# Patient Record
Sex: Female | Born: 1945 | Race: Black or African American | Hispanic: No | Marital: Married | Smoking: Current every day smoker
Health system: Southern US, Community
[De-identification: ages and names within clinical notes are randomized; demographics above are authoritative.]

## PROBLEM LIST (undated history)

## (undated) DIAGNOSIS — I10 Essential (primary) hypertension: Secondary | ICD-10-CM

---

## 2006-07-14 ENCOUNTER — Emergency Department: Payer: Self-pay | Admitting: Emergency Medicine

## 2006-07-14 ENCOUNTER — Other Ambulatory Visit: Payer: Self-pay

## 2006-11-09 ENCOUNTER — Ambulatory Visit: Payer: Self-pay | Admitting: Family Medicine

## 2008-02-23 IMAGING — MG MM CAD SCREENING MAMMO
1 series · 4 of 4 positions shown · non-contrast
Comparison: none

REASON FOR EXAM: Screening mammogram
COMMENTS:

PROCEDURE:     MAM - MAM DGTL SCREENING MAMMO W/CAD  - November 09, 2006  [DATE]
RESULT:     The patient reports last mammogram at [HOSPITAL]. However, there is no
existing prior exam here.

[Series 5807: R CC · right · 4 of 4 slices shown]
[im 1/4]
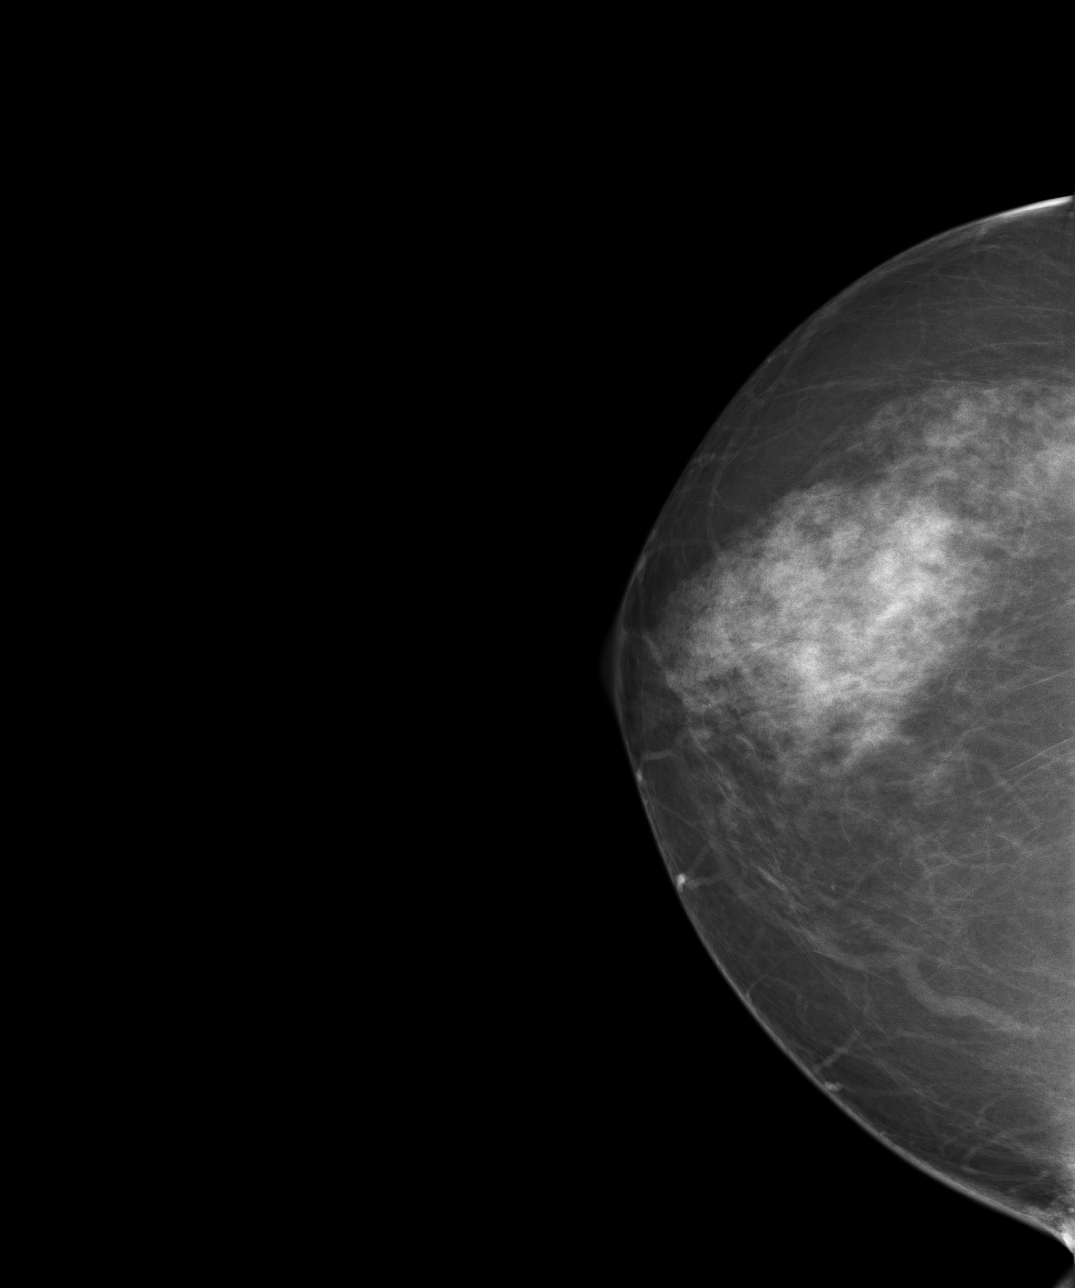
[im 2/4]
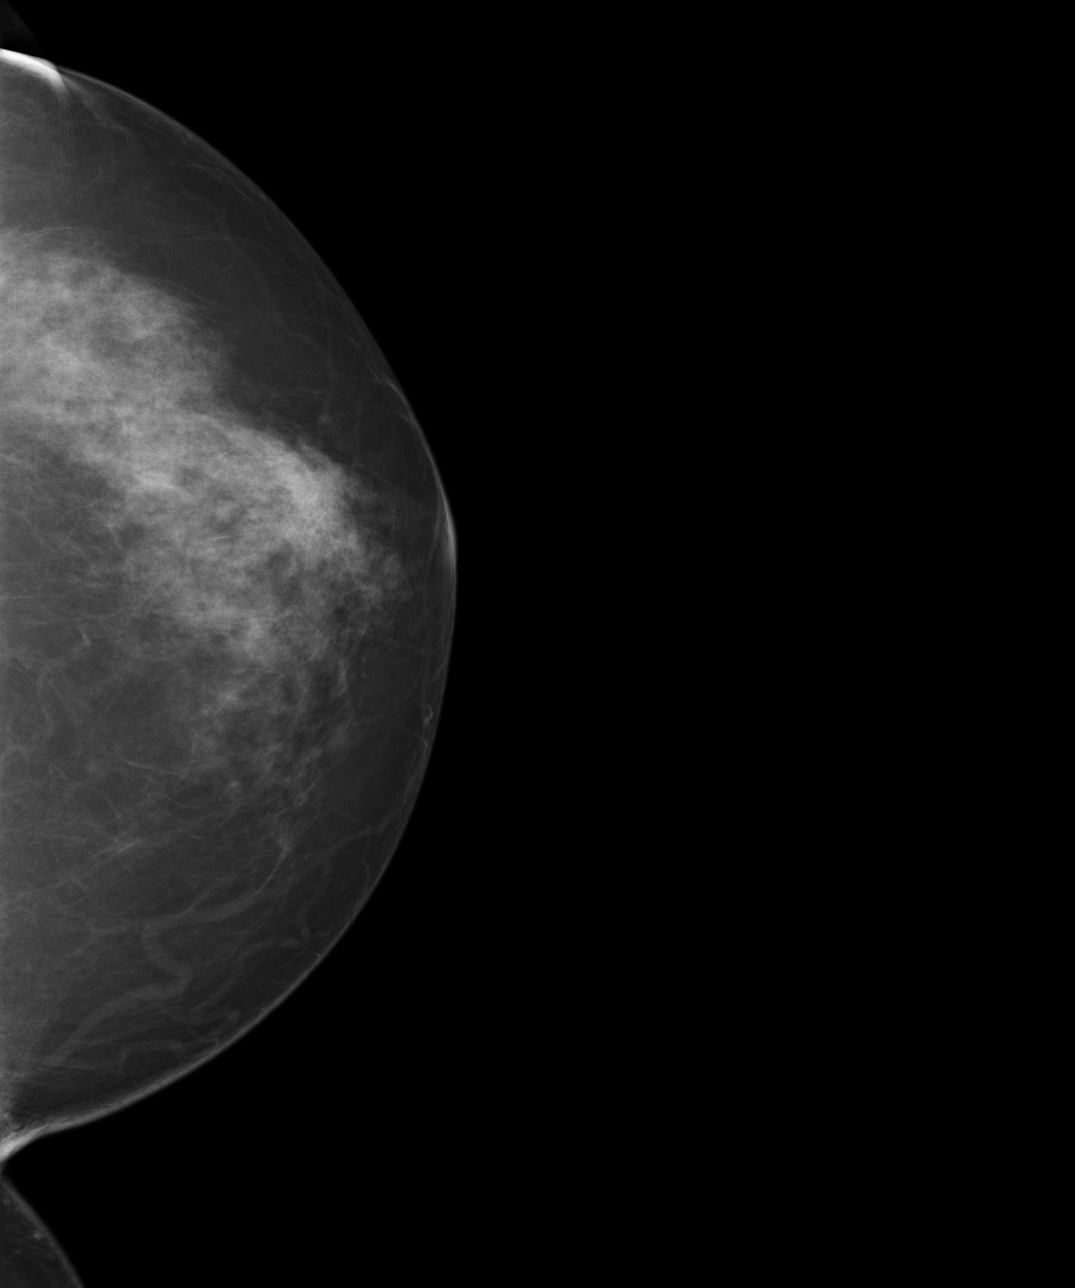
[im 3/4]
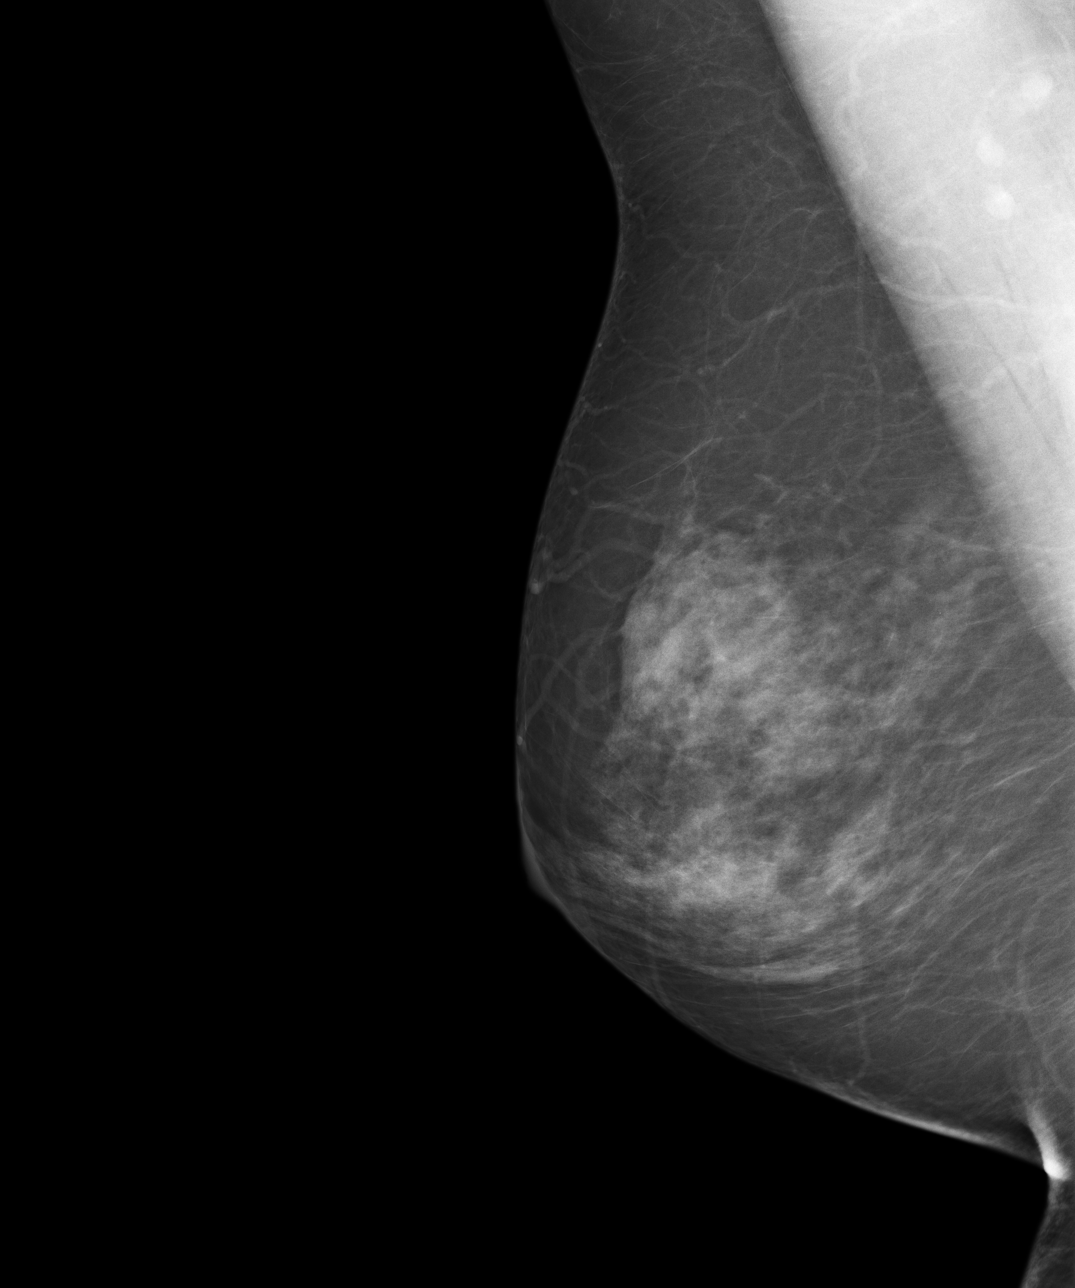
[im 4/4]
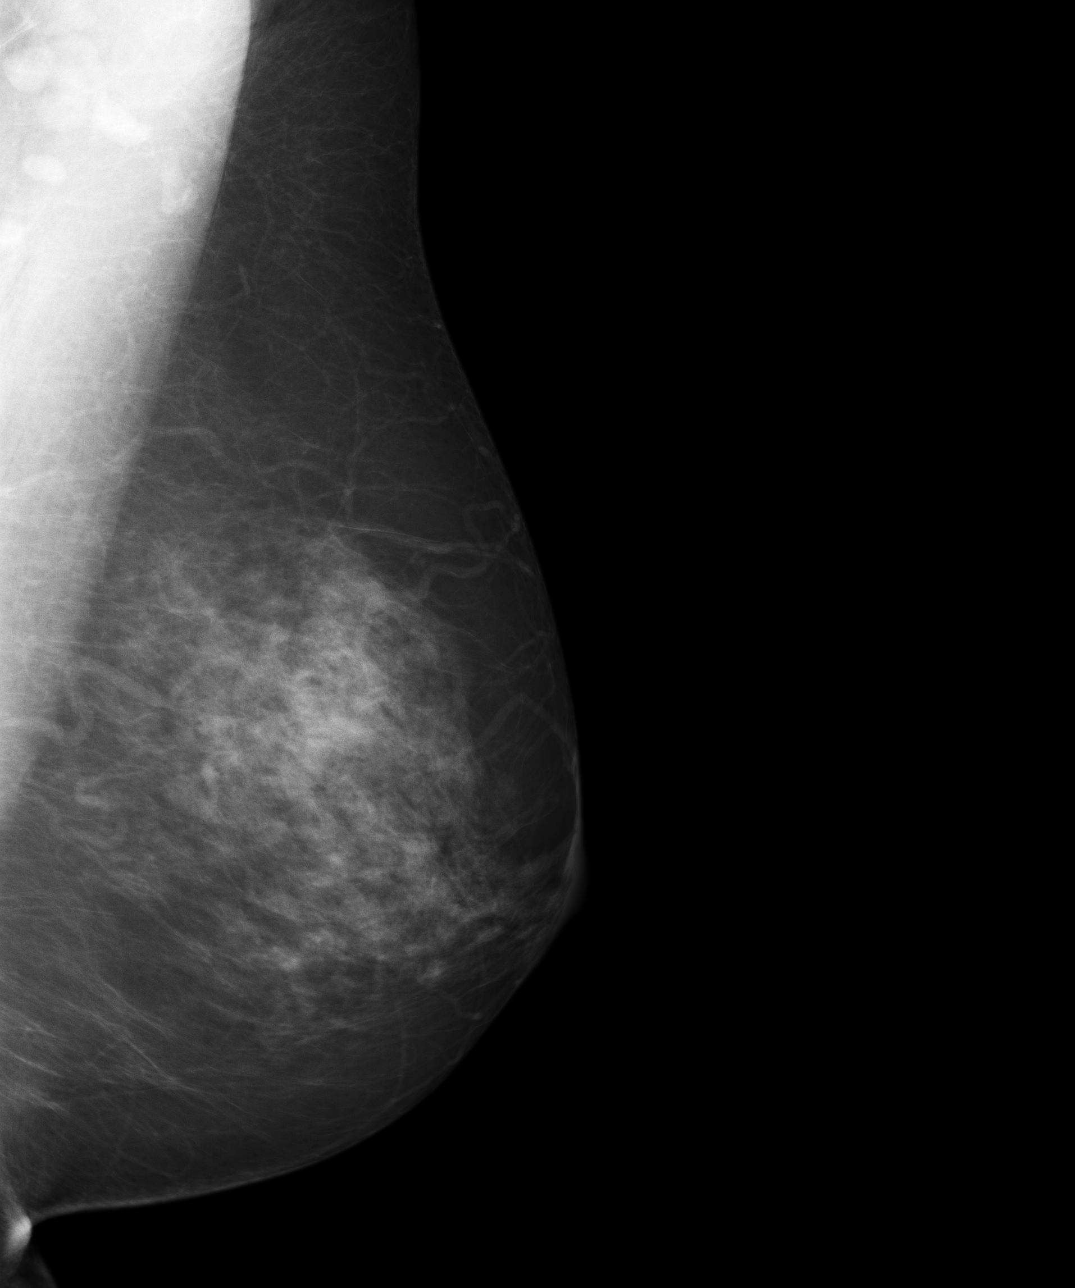

[4 of 4 positions shown; findings below may reference images not displayed]

FINDINGS: Heterogeneously dense bilateral breast parenchyma lowers
sensitivity for mammography.

No suspicious mass or calcification is noted.
IMPRESSION: 1.     Negative bilateral mammogram.
2.     Recommend routine follow-up mammogram in one year.
3.     BI-RADS: Category 1 - Negative

Thank you for this opportunity to contribute to the care of your patient.

A NEGATIVE MAMMOGRAM REPORT DOES NOT PRECLUDE BIOPSY OR OTHER EVALUATION OF
A CLINICALLY PALPABLE OR OTHERWISE SUSPICIOUS MASS OR LESION. BREAST CANCER
MAY NOT BE DETECTED BY MAMMOGRAPHY IN UP TO 10% OF CASES.

## 2009-07-10 ENCOUNTER — Emergency Department: Payer: Self-pay | Admitting: Emergency Medicine

## 2011-07-06 ENCOUNTER — Ambulatory Visit: Payer: Self-pay

## 2012-10-19 IMAGING — MG MM CAD SCREENING MAMMO
1 series · 4 of 4 positions shown · non-contrast
Comparison: none

REASON FOR EXAM: SCR MAMMO NO ORDER DR REI RAUDA [id number]
COMMENTS:

[R CC · right · 4 of 4 slices shown]
[im 1/4]
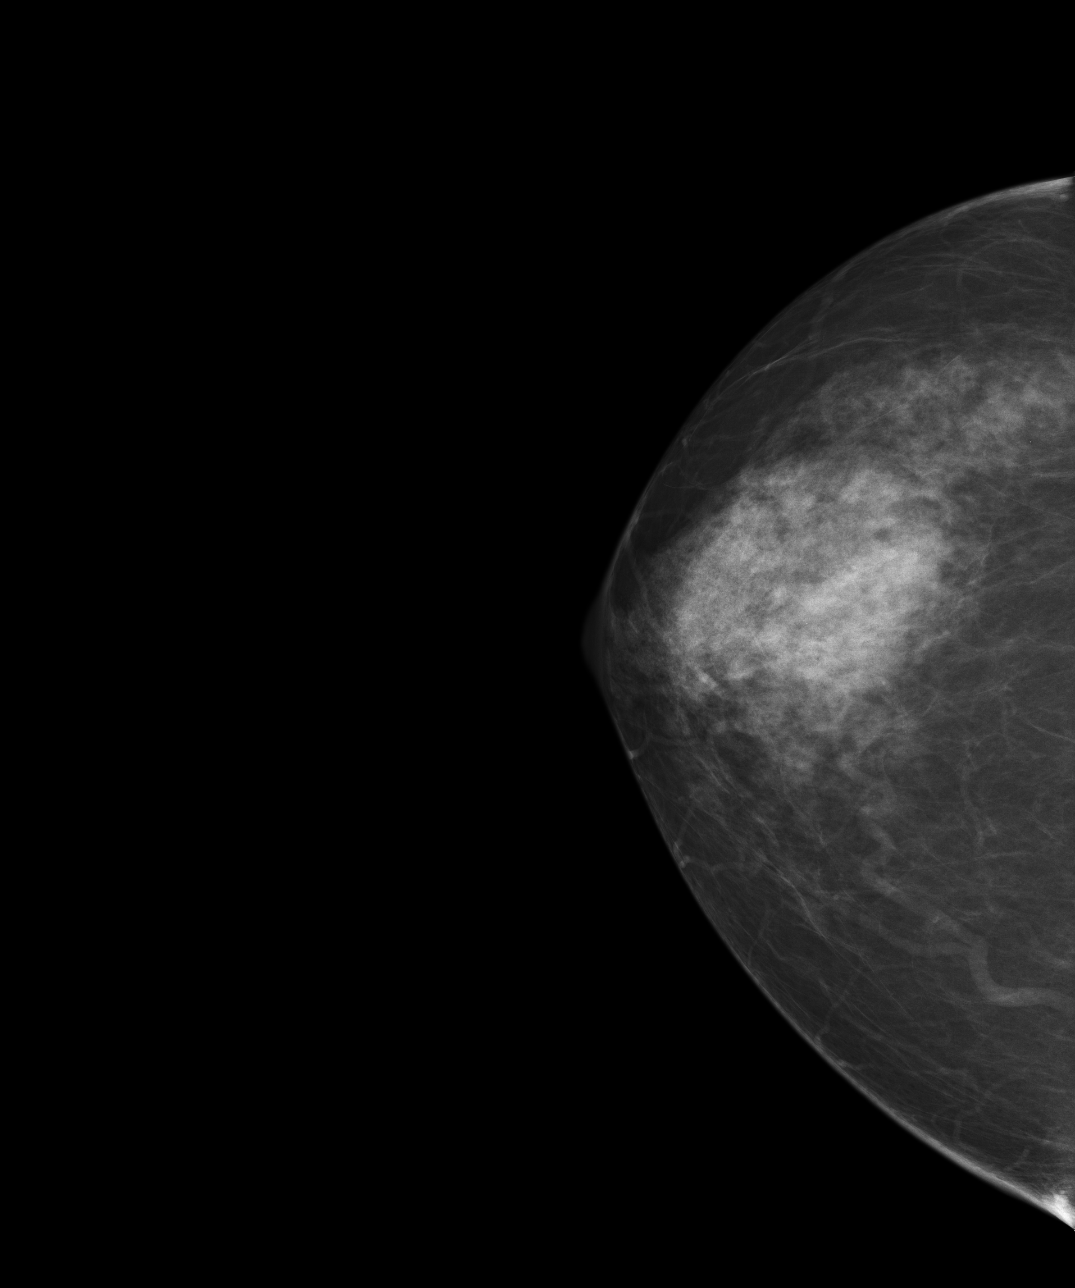
[im 2/4]
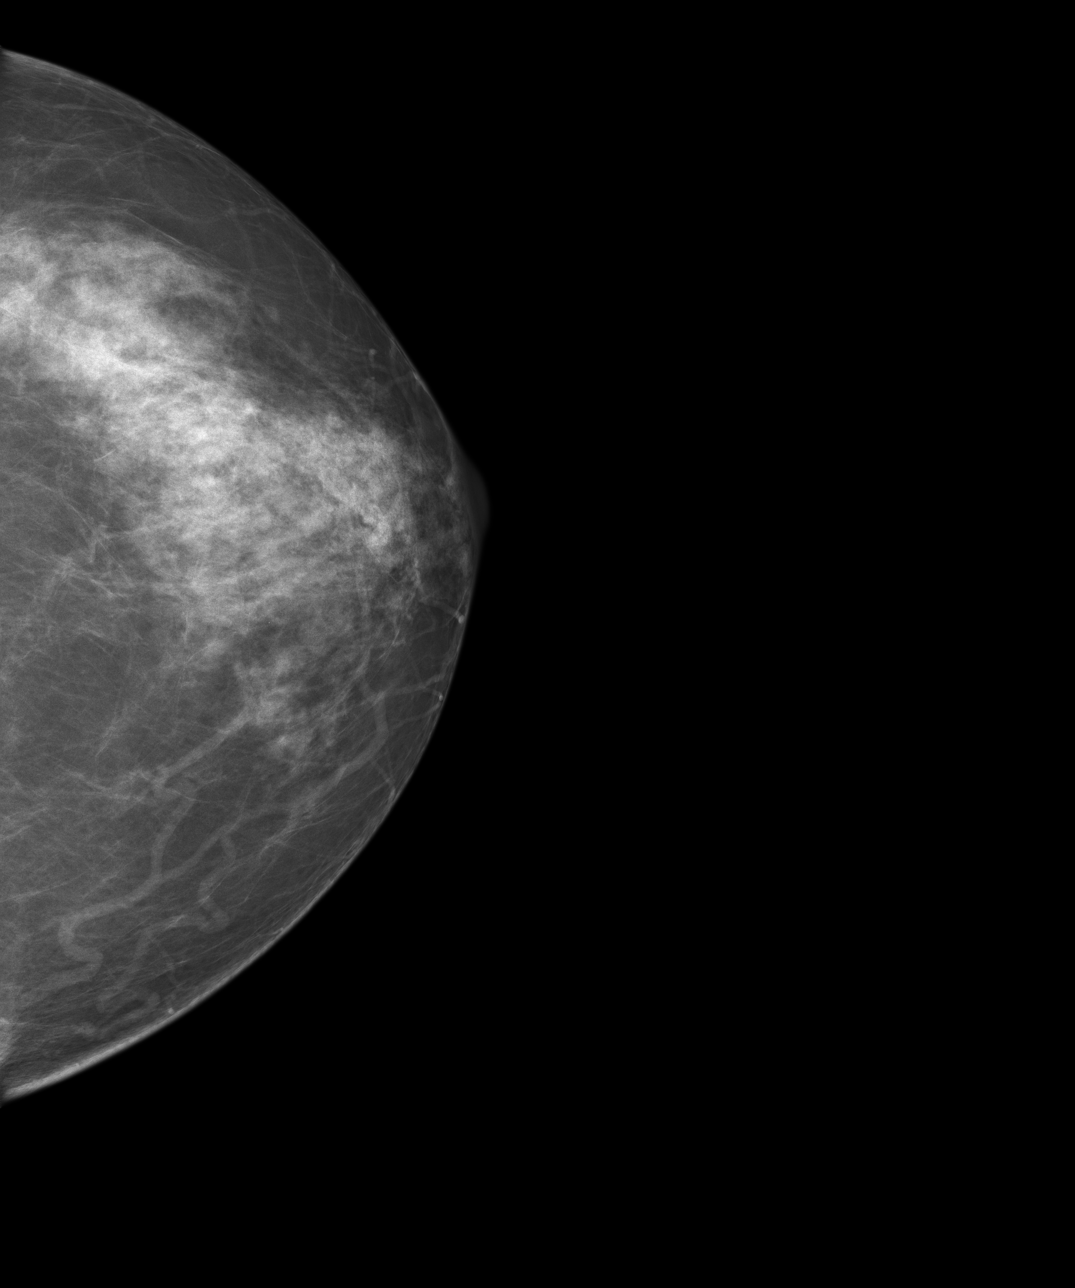
[im 3/4]
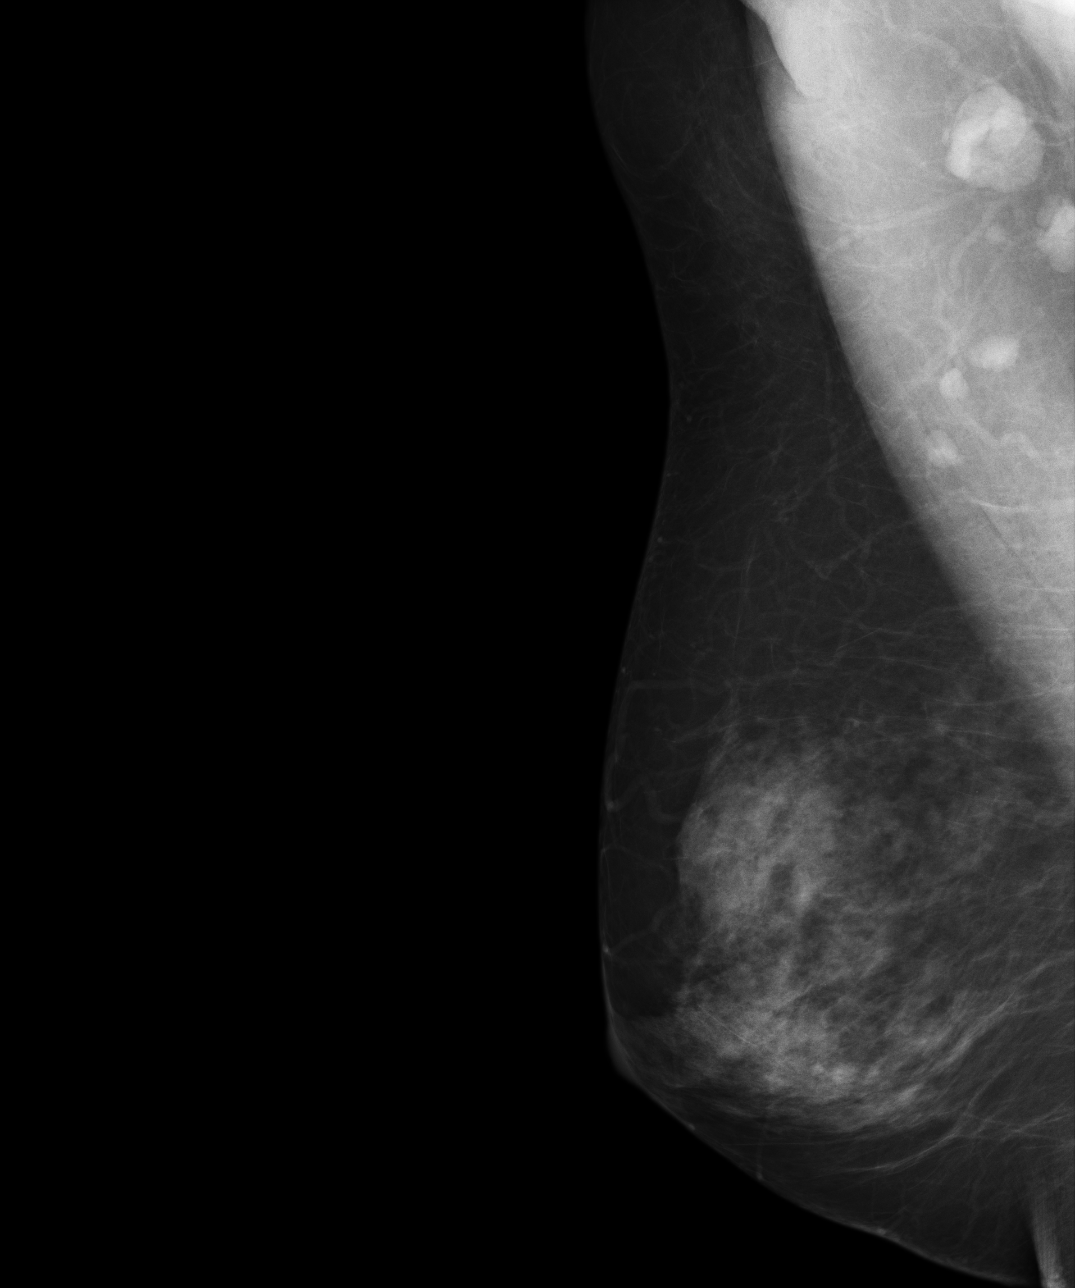
[im 4/4]
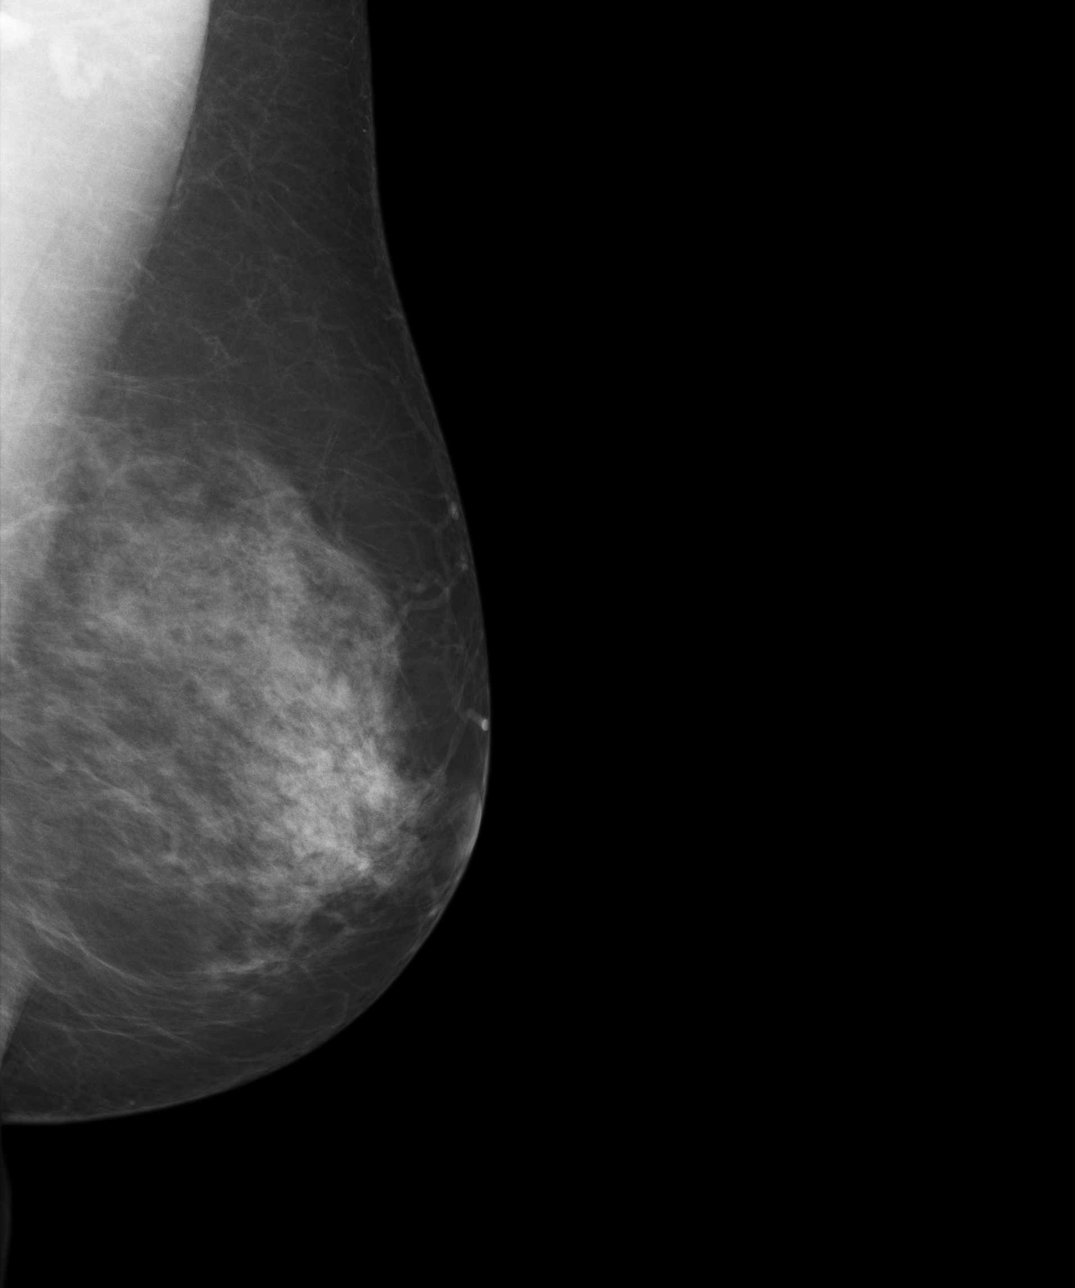

[4 of 4 positions shown; findings below may reference images not displayed]

PROCEDURE:     MAM - MAM DGTL SCRN MAM NO ORDER W/CAD  - July 06, 2011  [DATE]

RESULT:     Comparison is made to the prior exam of October 10, 2006. The
breast parenchyma bilaterally is heterogeneously dense. No dominant mass or
malignant-appearing microcalcifications are seen. Bilateral axillary lymph
nodes are seen. There are a few mildly prominent lymph nodes at the right
axilla. This region was not included on the prior exam of 5999. Nonetheless,
the lymph nodes have a benign appearance with fat observed centrally in the
larger that measures approximately 16 mm.
IMPRESSION: 1. Bilaterally benign-appearing screening mammography.
2. Annual screening mammography is recommended. In particular, repeat
screening mammography in one year is suggested to document stability of the
right axillary lymph nodes.
3. BI-RADS:  Category 2- Benign Finding.

A negative mammogram report does not preclude biopsy or other evaluation of
a clinically palpable or otherwise suspicious mass or lesion.  Breast cancer
may not be detected by mammography in up to 10% of cases.

## 2013-03-06 ENCOUNTER — Ambulatory Visit: Payer: Self-pay | Admitting: Family Medicine

## 2013-04-21 ENCOUNTER — Emergency Department: Payer: Self-pay | Admitting: Emergency Medicine

## 2014-08-05 IMAGING — CR RIGHT FOOT COMPLETE - 3+ VIEW
1 series · 3 of 3 positions shown · non-contrast
Comparison: None

CLINICAL DATA: Twist injury on ladder 5 days ago, pain

EXAM:
RIGHT FOOT COMPLETE - 3+ VIEW

[Series 1: x foot ap right · 0.14mm/px · 3 of 3 slices shown]
[im 1/3]
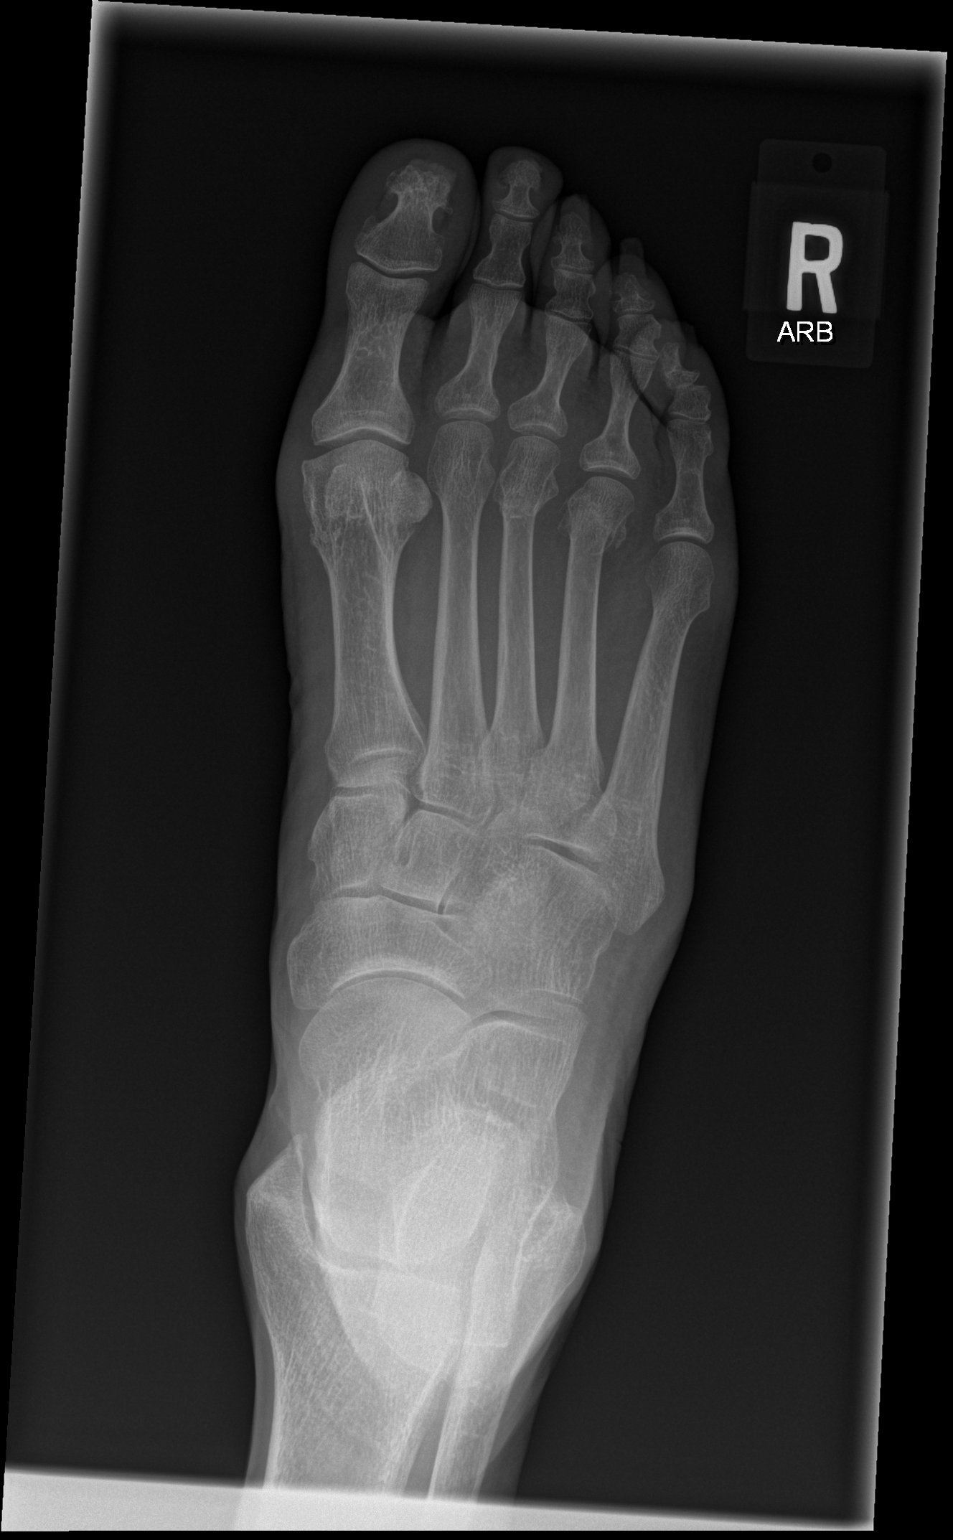
[im 2/3]
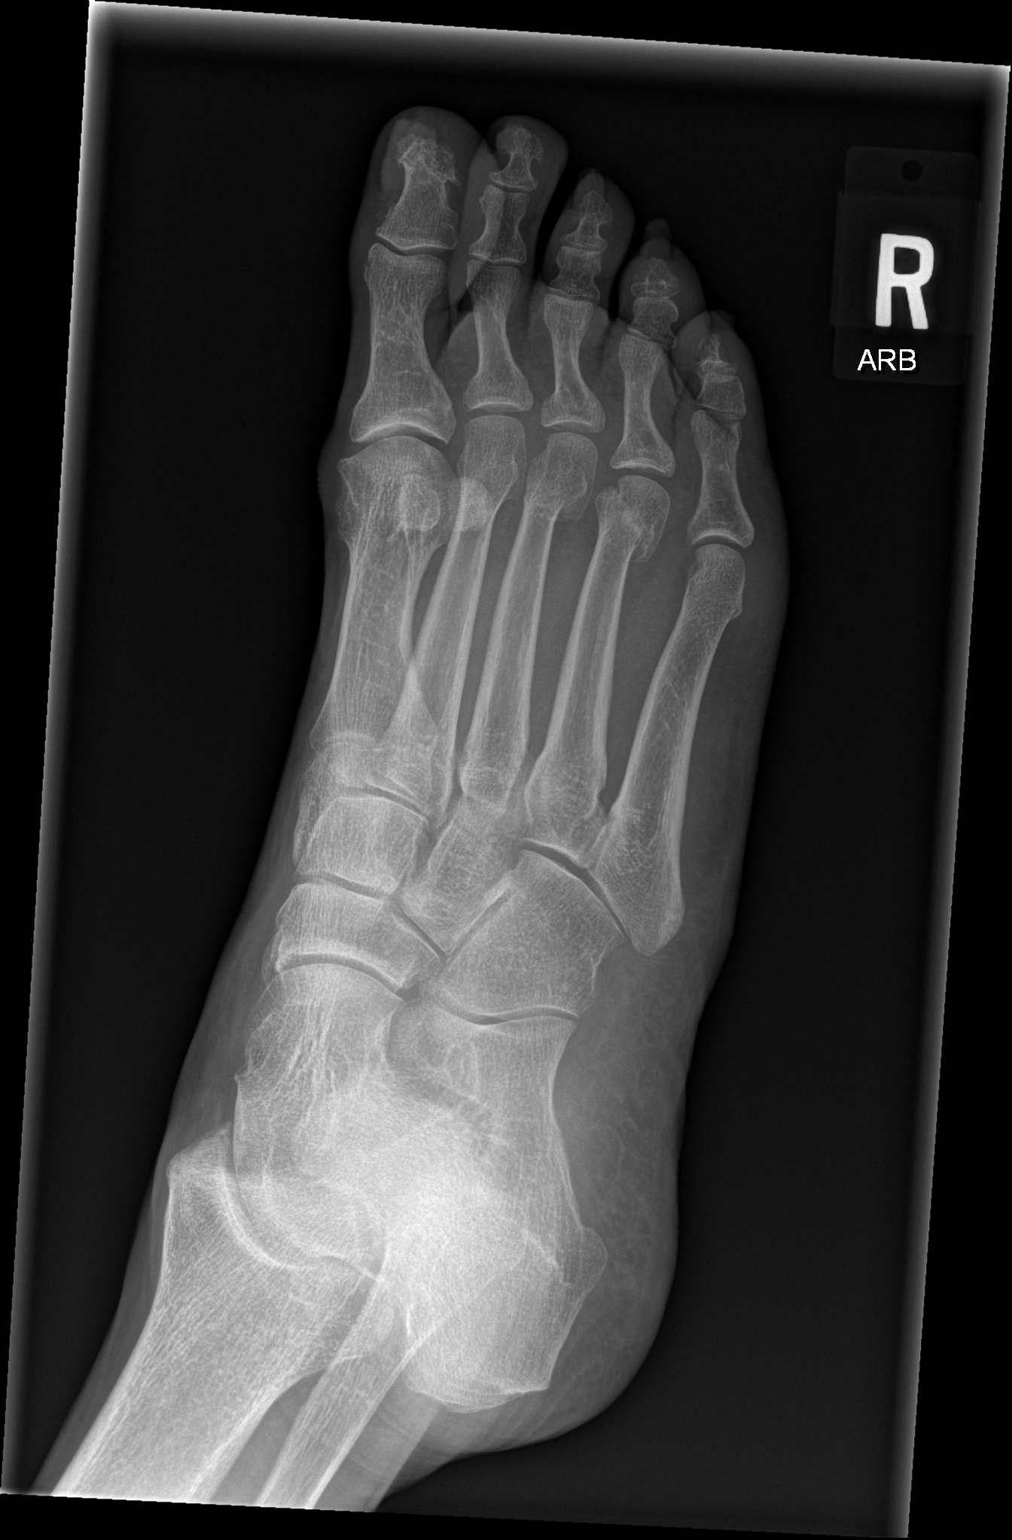
[im 3/3]
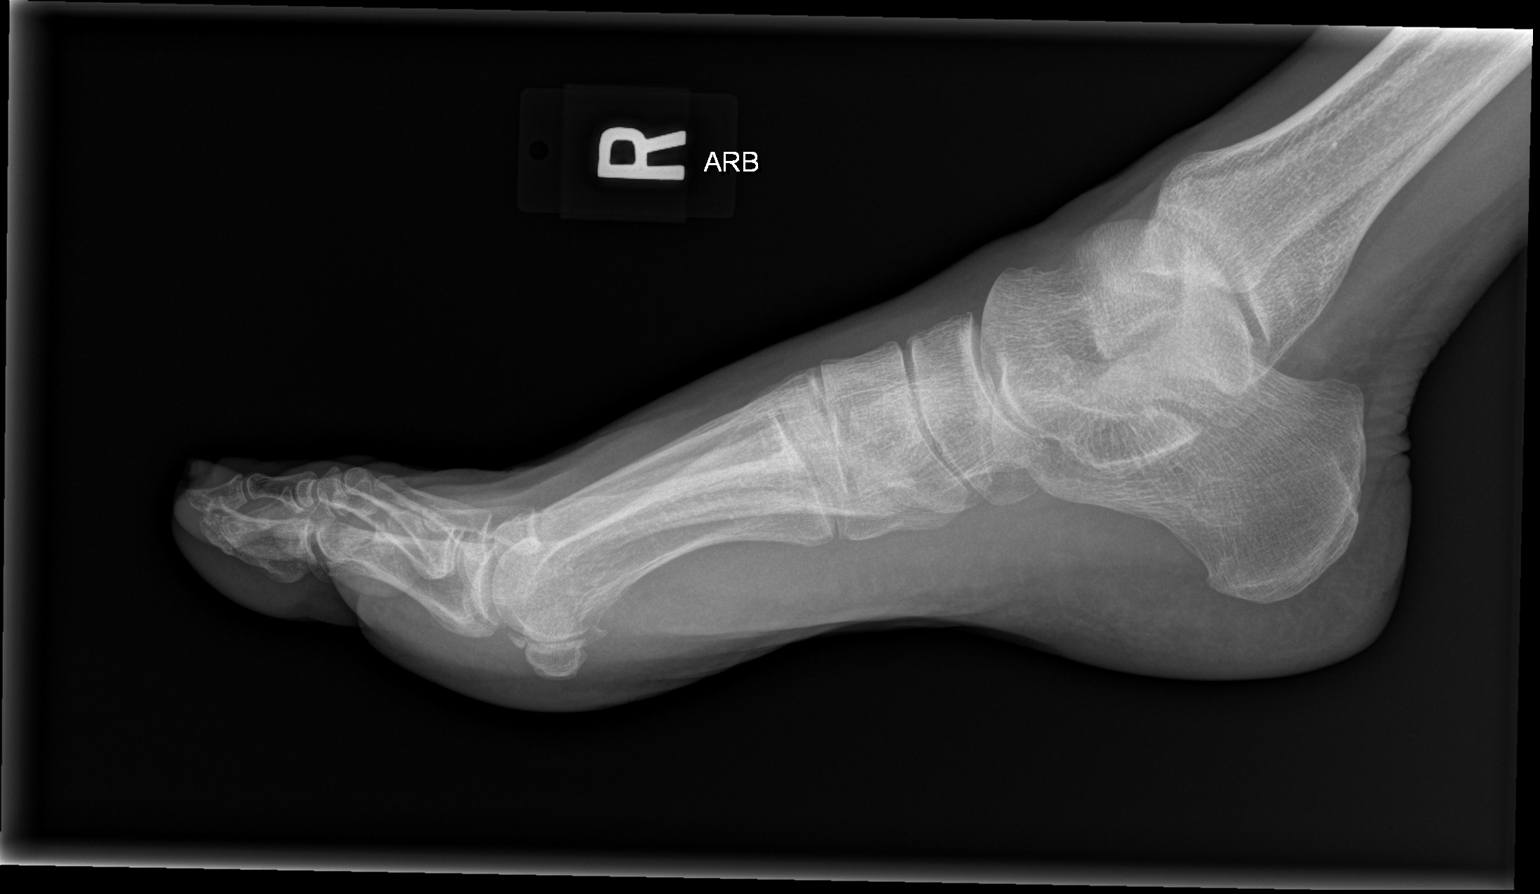

[3 of 3 positions shown; findings below may reference images not displayed]

FINDINGS: Bones appear demineralized.

Minimally displaced fractures of the distal 3rd and 4th metatarsals.

Joint spaces preserved.

No additional fracture dislocation identified.
IMPRESSION: Osseous demineralization with mildly displaced fractures of the
distal right 3rd and 4th metatarsals.

## 2015-06-26 ENCOUNTER — Other Ambulatory Visit: Payer: Self-pay | Admitting: Family Medicine

## 2015-06-26 DIAGNOSIS — Z1231 Encounter for screening mammogram for malignant neoplasm of breast: Secondary | ICD-10-CM

## 2015-07-21 ENCOUNTER — Ambulatory Visit: Payer: Self-pay | Attending: Family Medicine

## 2018-02-22 ENCOUNTER — Other Ambulatory Visit: Payer: Self-pay | Admitting: Family Medicine

## 2018-02-22 DIAGNOSIS — Z1231 Encounter for screening mammogram for malignant neoplasm of breast: Secondary | ICD-10-CM

## 2018-02-22 DIAGNOSIS — E2839 Other primary ovarian failure: Secondary | ICD-10-CM

## 2019-05-29 ENCOUNTER — Other Ambulatory Visit: Payer: Self-pay | Admitting: Family Medicine

## 2019-05-29 DIAGNOSIS — Z1231 Encounter for screening mammogram for malignant neoplasm of breast: Secondary | ICD-10-CM

## 2019-05-29 DIAGNOSIS — E2839 Other primary ovarian failure: Secondary | ICD-10-CM

## 2022-06-04 ENCOUNTER — Inpatient Hospital Stay
Admission: EM | Admit: 2022-06-04 | Discharge: 2022-06-06 | DRG: 522 | Disposition: A | Discharge status: Home Health | Payer: Medicare Other | Attending: Osteopathic Medicine | Admitting: Osteopathic Medicine

## 2022-06-04 ENCOUNTER — Encounter: Payer: Self-pay | Admitting: Emergency Medicine

## 2022-06-04 ENCOUNTER — Other Ambulatory Visit: Payer: Self-pay

## 2022-06-04 ENCOUNTER — Emergency Department: Payer: Medicare Other

## 2022-06-04 DIAGNOSIS — S72091A Other fracture of head and neck of right femur, initial encounter for closed fracture: Secondary | ICD-10-CM | POA: Diagnosis present

## 2022-06-04 DIAGNOSIS — D72829 Elevated white blood cell count, unspecified: Secondary | ICD-10-CM | POA: Insufficient documentation

## 2022-06-04 DIAGNOSIS — Y92008 Other place in unspecified non-institutional (private) residence as the place of occurrence of the external cause: Secondary | ICD-10-CM

## 2022-06-04 DIAGNOSIS — S72001A Fracture of unspecified part of neck of right femur, initial encounter for closed fracture: Principal | ICD-10-CM

## 2022-06-04 DIAGNOSIS — R54 Age-related physical debility: Secondary | ICD-10-CM | POA: Diagnosis present

## 2022-06-04 DIAGNOSIS — I10 Essential (primary) hypertension: Secondary | ICD-10-CM | POA: Diagnosis present

## 2022-06-04 DIAGNOSIS — Z809 Family history of malignant neoplasm, unspecified: Secondary | ICD-10-CM

## 2022-06-04 DIAGNOSIS — Z79899 Other long term (current) drug therapy: Secondary | ICD-10-CM | POA: Diagnosis not present

## 2022-06-04 DIAGNOSIS — E440 Moderate protein-calorie malnutrition: Secondary | ICD-10-CM | POA: Diagnosis present

## 2022-06-04 DIAGNOSIS — W010XXA Fall on same level from slipping, tripping and stumbling without subsequent striking against object, initial encounter: Secondary | ICD-10-CM | POA: Diagnosis present

## 2022-06-04 DIAGNOSIS — I2510 Atherosclerotic heart disease of native coronary artery without angina pectoris: Secondary | ICD-10-CM | POA: Diagnosis present

## 2022-06-04 DIAGNOSIS — I739 Peripheral vascular disease, unspecified: Secondary | ICD-10-CM | POA: Diagnosis present

## 2022-06-04 DIAGNOSIS — E782 Mixed hyperlipidemia: Secondary | ICD-10-CM | POA: Diagnosis not present

## 2022-06-04 DIAGNOSIS — F17210 Nicotine dependence, cigarettes, uncomplicated: Secondary | ICD-10-CM | POA: Diagnosis present

## 2022-06-04 DIAGNOSIS — Z681 Body mass index (BMI) 19 or less, adult: Secondary | ICD-10-CM | POA: Diagnosis not present

## 2022-06-04 DIAGNOSIS — E785 Hyperlipidemia, unspecified: Secondary | ICD-10-CM | POA: Diagnosis present

## 2022-06-04 DIAGNOSIS — S7291XA Unspecified fracture of right femur, initial encounter for closed fracture: Secondary | ICD-10-CM | POA: Diagnosis present

## 2022-06-04 HISTORY — DX: Essential (primary) hypertension: I10

## 2022-06-04 LAB — BASIC METABOLIC PANEL
Anion gap: 10 (ref 5–15)
BUN: 17 mg/dL (ref 8–23)
CO2: 29 mmol/L (ref 22–32)
Calcium: 8.9 mg/dL (ref 8.9–10.3)
Chloride: 98 mmol/L (ref 98–111)
Creatinine, Ser: 0.86 mg/dL (ref 0.44–1.00)
GFR, Estimated: >60 mL/min (ref >60)
Glucose, Bld: 126 mg/dL — ABNORMAL HIGH (ref 70–99)
Potassium: 3.6 mmol/L (ref 3.5–5.1)
Sodium: 137 mmol/L (ref 135–145)

## 2022-06-04 LAB — CBC
HCT: 37.3 % (ref 36.0–46.0)
Hemoglobin: 12.4 g/dL (ref 12.0–15.0)
MCH: 31.9 pg (ref 26.0–34.0)
MCHC: 33.2 g/dL (ref 30.0–36.0)
MCV: 95.9 fL (ref 80.0–100.0)
Platelets: 313 10*3/uL (ref 150–400)
RBC: 3.89 MIL/uL (ref 3.87–5.11)
RDW: 14 % (ref 11.5–15.5)
WBC: 12.1 10*3/uL — ABNORMAL HIGH (ref 4.0–10.5)
nRBC: 0 % (ref 0.0–0.2)

## 2022-06-04 LAB — PROTIME-INR
INR: 1 (ref 0.8–1.2)
Prothrombin Time: 13.5 seconds (ref 11.4–15.2)

## 2022-06-04 MED ORDER — MORPHINE SULFATE (PF) 2 MG/ML IV SOLN: 1.0000 mg | INTRAVENOUS | Status: DC | PRN

## 2022-06-04 MED ORDER — MORPHINE SULFATE (PF) 2 MG/ML IV SOLN
1.0000 mg | INTRAVENOUS | Status: AC | PRN
  Administered 2022-06-04 – 2022-06-05 (×3): 1 mg via INTRAVENOUS
  Filled 2022-06-04 (×3): qty 1

## 2022-06-04 MED ORDER — HYDROCODONE-ACETAMINOPHEN 5-325 MG PO TABS: 1.0000 | ORAL_TABLET | Freq: Four times a day (QID) | ORAL | Status: DC | PRN

## 2022-06-04 MED ORDER — FENTANYL CITRATE PF 50 MCG/ML IJ SOSY
25.0000 ug | PREFILLED_SYRINGE | Freq: Once | INTRAMUSCULAR | Status: AC
  Administered 2022-06-04: 25 ug via INTRAVENOUS
  Filled 2022-06-04: qty 1

## 2022-06-04 MED ORDER — ROSUVASTATIN CALCIUM 10 MG PO TABS
10.0000 mg | ORAL_TABLET | Freq: Every day | ORAL | Status: DC
  Administered 2022-06-04 – 2022-06-05 (×2): 10 mg via ORAL
  Filled 2022-06-04 (×2): qty 1

## 2022-06-04 MED ORDER — HYDRALAZINE HCL 20 MG/ML IJ SOLN: 5.0000 mg | Freq: Three times a day (TID) | INTRAMUSCULAR | Status: DC | PRN

## 2022-06-04 MED ORDER — MORPHINE SULFATE (PF) 2 MG/ML IV SOLN: 0.5000 mg | INTRAVENOUS | Status: DC | PRN

## 2022-06-04 MED ORDER — HEPARIN SODIUM (PORCINE) 5000 UNIT/ML IJ SOLN: 5000.0000 [IU] | Freq: Three times a day (TID) | INTRAMUSCULAR | Status: DC

## 2022-06-04 MED ORDER — METOPROLOL SUCCINATE ER 50 MG PO TB24
50.0000 mg | ORAL_TABLET | Freq: Every day | ORAL | Status: DC
  Administered 2022-06-06: 50 mg via ORAL
  Filled 2022-06-04: qty 1

## 2022-06-04 MED ORDER — AMLODIPINE BESYLATE 5 MG PO TABS
5.0000 mg | ORAL_TABLET | Freq: Every day | ORAL | Status: DC
  Administered 2022-06-06: 5 mg via ORAL
  Filled 2022-06-04: qty 1

## 2022-06-04 MED ORDER — NICOTINE 21 MG/24HR TD PT24
21.0000 mg | MEDICATED_PATCH | Freq: Every day | TRANSDERMAL | Status: DC | PRN
  Administered 2022-06-04: 21 mg via TRANSDERMAL
  Filled 2022-06-04: qty 1

## 2022-06-04 MED ORDER — LOSARTAN POTASSIUM 50 MG PO TABS
100.0000 mg | ORAL_TABLET | Freq: Every day | ORAL | Status: DC
  Administered 2022-06-06: 100 mg via ORAL
  Filled 2022-06-04: qty 2

## 2022-06-04 MED ORDER — HYDROCODONE-ACETAMINOPHEN 5-325 MG PO TABS
1.0000 | ORAL_TABLET | Freq: Four times a day (QID) | ORAL | Status: DC | PRN
  Administered 2022-06-04 – 2022-06-06 (×2): 1 via ORAL
  Filled 2022-06-04 (×2): qty 1

## 2022-06-04 MED ORDER — SENNOSIDES-DOCUSATE SODIUM 8.6-50 MG PO TABS: 1.0000 | ORAL_TABLET | Freq: Every evening | ORAL | Status: DC | PRN

## 2022-06-04 NOTE — Hospital Course (Signed)
Translator Ms. Veronica Lucas is a 77 year old female with history of hypertension, multiple lung nodules, who presents emergency department for chief concerns of ground level fall.  02/10: VSS/hypertensive, WBC slightly up at 12.1, CXR no concerns, Right hip x-ray was (+)right femoral neck fracture. EDP consulted orthopedic surgery who states that the patient will go for a right hip hemiarthroplasty tomorrow. 02/11: underwent Right Hip Anterior Hip Hemiarthroplasty. PT/OT ordered for tomorrow.   02/12:   Consultants:  Orthopedics   Procedures: 06/05/22 Right Hip Anterior Hip Hemiarthroplasty       ASSESSMENT & PLAN:   Principal Problem:   Right femoral fracture (HCC) Active Problems:   Tobacco smoker, 1 pack of cigarettes or less per day   Essential hypertension   Hyperlipidemia   Leukocytosis   Right femoral fracture (HCC) Orthopedics following POD1 Right Hip Anterior Hip Hemiarthroplasty  Fall precaution Hydrocodone-acetaminophen 5-325 mg p.o. every 6 hours as needed for moderate pain, 3 doses ordered. Morphine 1 mg IV every 2 hours as needed for severe pain   Leukocytosis Suspect reactive secondary to right femoral fracture CBC in a.m.   Hyperlipidemia Rosuvastatin 10 mg nightly resumed   Essential hypertension Resumed home amlodipine 5 mg daily, losartan 100 mg daily, metoprolol succinate 50 mg daily Hydralazine 5 mg IV every 8 hours as needed for SBP greater 175, 4 days ordered   Tobacco smoker, 1 pack of cigarettes or less per day Patient states she is not ready to quit smoking Prn nicotine patch ordered    DVT prophylaxis: TED hose pending surgery  Pertinent IV fluids/nutrition: no continuous IV fluids, NPO pending surgery   Central lines / invasive devices: none  Code Status: FULL CODE   Current Admission Status: inpatient  TOC needs / Dispo plan: likely will need SNF/HH - pt prefers to go back home if possible but she does live by herself so would need  to be relatively ambulatory and have appropriate DME/help available, PT/OT to assess postop Barriers to discharge / significant pending items: surgery yesterday, PT/OT today, expect may need HH/placement.

## 2022-06-04 NOTE — Assessment & Plan Note (Signed)
-   Suspect reactive secondary to right femoral fracture - Leukocytosis patient for infectious etiology due to negative symptoms on review of system - CBC in a.m.

## 2022-06-04 NOTE — Assessment & Plan Note (Addendum)
-   Fall precaution - Hydrocodone-acetaminophen 5-325 mg p.o. every 6 hours as needed for moderate pain, 3 doses ordered - Morphine 1 mg IV every 2 hours as needed for severe pain - Orthopedic service has been consulted - N.p.o. after midnight - Admit to [facility], inpatient

## 2022-06-04 NOTE — Assessment & Plan Note (Signed)
-   Rosuvastatin 10 mg nightly resumed

## 2022-06-04 NOTE — ED Provider Notes (Signed)
[facility] Provider Note    Event Date/Time   First MD Initiated Contact with Patient 06/04/22 1636     (approximate)   History   No chief complaint on file.   HPI  Veronica Lucas is a 77 y.o. female history of hypertension  She was walking today had her purse in her hand but the strap from that was hanging down.  She believes she stumbled or tripped over the strap.  Caused her to fall directly on her right hip and she has severe pain in the hip only.  No other injury did not strike her head no neck pain no injury to the left leg feet ankles, abdomen back or other areas.  Currently pain is very minimal if any but does hurt if she attempts to move the right hip.   Review of [facility] medical record from October indicates history of lung nodules, hypertension  Patient's daughter also at bedside  Physical Exam   Triage Vital Signs: ED Triage Vitals  Enc Vitals Group     BP 06/04/22 1629 (!) 184/24     Pulse Rate 06/04/22 1629 69     Resp 06/04/22 1629 20     Temp 06/04/22 1629 (!) 97.5 F (36.4 C)     Temp Source 06/04/22 1629 Oral     SpO2 06/04/22 1629 99 %     Weight 06/04/22 1626 96 lb (43.5 kg)     Height 06/04/22 1626 5' 6"$  (1.676 m)     Head Circumference --      Peak Flow --      Pain Score 06/04/22 1626 0     Pain Loc --      Pain Edu? --      Excl. in [place]? --     Most recent vital signs: Vitals:   06/04/22 1629 06/04/22 1630  BP: (!) 184/24 (!) 174/77  Pulse: 69 72  Resp: 20 10  Temp: (!) 97.5 F (36.4 C)   SpO2: 99% 99%     General: Awake, no distress.  Ports her pain is well-controlled at this time she is resting comfortably.  Right leg held in slight external rotation CV:  Good peripheral perfusion.  Strong palpable dorsalis pedis and posterior tibial pulses in the right lower extremity.  Normal heart tones and rate Resp:  Normal effort.  Clear bilateral Abd:  No distention.  Soft nontender.  No tenderness noted over the bony  pelvis. Other:  Warm well-perfused lower extremities.  Moves upper extremities without injury or pain.  Right lower extremity slightly externally rotated, able to wiggle her toes.  No tenderness to palpation along the foot and ankle tib-fib knee but has tenderness over the right upper leg and hip joint.  No open wounds abrasions or contusion noted over the right hip joint.   ED Results / Procedures / Treatments   Labs (all labs ordered are listed, but only abnormal results are displayed) Labs Reviewed  CBC - Abnormal; Notable for the following components:      Result Value   WBC 12.1 (*)    All other components within normal limits  BASIC METABOLIC PANEL - Abnormal; Notable for the following components:   Glucose, Bld 126 (*)    All other components within normal limits  PROTIME-INR  CBC  BASIC METABOLIC PANEL     EKG     RADIOLOGY  Right hip x-ray interpreted by me as right femoral neck fracture  DG Chest [facility]  1 View  Result Date: 06/04/2022 CLINICAL DATA:  Ground level fall.  Right hip fracture. EXAM: PORTABLE CHEST 1 VIEW COMPARISON:  None Available. FINDINGS: Cardiac silhouette normal in size.  No mediastinal or hilar masses. Clear lungs. No gross pleural effusion or pneumothorax on this supine study. Skeletal structures are grossly intact. IMPRESSION: No acute cardiopulmonary disease. Electronically Signed   By: Lajean Manes M.D.   On: 06/04/2022 17:22   DG Hip Unilat W or Wo Pelvis 2-3 Views Right  Result Date: 06/04/2022 CLINICAL DATA:  Ground level fall.  Right hip pain. EXAM: DG HIP (WITH OR WITHOUT PELVIS) 2-3V RIGHT COMPARISON:  None Available. FINDINGS: Right femoral neck fracture, mid cervical, non comminuted, mildly displaced with varus angulation. No other fractures.  No bone lesions. Hip joints, SI joints and pubic symphysis are normally spaced and aligned. Scattered iliofemoral arterial vascular calcifications. IMPRESSION: 1. Mildly displaced, varus angulated  right femoral neck fracture. No dislocation. Electronically Signed   By: Lajean Manes M.D.   On: 06/04/2022 17:21      PROCEDURES:  Critical Care performed: No  Procedures   MEDICATIONS ORDERED IN ED: Medications  morphine (PF) 2 MG/ML injection 0.5 mg (has no administration in time range)  heparin injection 5,000 Units (has no administration in time range)  senna-docusate (Senokot-S) tablet 1 tablet (has no administration in time range)  HYDROcodone-acetaminophen (NORCO/VICODIN) 5-325 MG per tablet 1 tablet (has no administration in time range)  fentaNYL (SUBLIMAZE) injection 25 mcg (25 mcg Intravenous Given 06/04/22 1851)     IMPRESSION / MDM / ASSESSMENT AND PLAN / ED COURSE  I reviewed the triage vital signs and the nursing notes.                              Differential diagnosis includes, but is not limited to, contusion, fall, pelvic fracture, right hip fracture etc.  Fall appears to be mechanical in nature and accidental she landed directly on her right hip did not strike her head no other injuries no neck pain.  She is alert well-oriented not in any anticoagulants.  Pain is currently well-controlled after EMS intervention, she is neurovascular intact in right lower extremity without evidence of compromise.  X-ray demonstrates right hip fracture.  I discussed her case with Dr. Harlow Mares of orthopedic surgery who advises current plan to make n.p.o. at midnight and admit to hospitalist  Patient's presentation is most consistent with acute presentation with potential threat to life or bodily function. (Loss of R hip mobility)   The patient is on the cardiac monitor to evaluate for evidence of arrhythmia and/or significant heart rate changes.   ----------------------------------------- 7:04 PM on 06/04/2022 ----------------------------------------- Consulted with the hospitalist service, patient be admitted to the service of Dr. Tobie Poet.  Labs reviewed mild leukocytosis.  Metabolic  panel normal.  Patient resting comfortably, additional fentanyl given for some breakthrough pain.  Patient and her daughter both aware understanding of plan for admission to the hospital and diagnosis of hip fracture.  Anticipate Dr. Harlow Mares will see and evaluate the patient     FINAL CLINICAL IMPRESSION(S) / ED DIAGNOSES   Final diagnoses:  Closed right hip fracture, initial encounter [facility])     Rx / DC Orders   ED Discharge Orders     None        Note:  This document was prepared using Dragon voice recognition software and may include unintentional dictation errors.   Ninoska Goswick,  Elta Guadeloupe, MD 06/04/22 Drema Halon

## 2022-06-04 NOTE — ED Notes (Signed)
Pt placed on purewick 

## 2022-06-04 NOTE — H&P (Addendum)
History and Physical   Veronica Lucas [id number] DOB: 1945/07/03 DOA: 06/04/2022  PCP: Wardell Honour, MD  Patient coming from: home via EMS  I have personally briefly reviewed patient's old medical records in [place].  Chief Concern: fall  HPI: Translator Ms. Veronica Lucas is a 77 year old female with history of hypertension, multiple lung nodules, who presents emergency department for chief concerns of ground level fall.   Initial vitals in the ED showed temperature 97.5, respiration rate of 10, heart rate 72, blood pressure 174 over 77, SpO2 of 99 on RA.   Serum sodium is 137, k 3.6, chloride 98, bicarb 29, BUN of 17, serum creatinine 0.86, nonfasting blood glucose 126, EGFR greater than 60, WBC 12.1.  Hemoglobin 12.4, platelets of 313.  Right hip x-ray was read as right femoral neck fracture.  EDP consulted orthopedic surgery who states that the patient will go for a right hip hemiarthroplasty tomorrow.  ED treatment: Fentanyl 25 mcg IV ------------------------ At bedside, she is awake and alert to name, age, location, current year.  She does not appear to be in acute distress. She was walking with her handbag that had a long shoulder strap and she stripped over the handbag strap. She denies head trauma, lost of consciousness.   She denies chest pain, shortness of breath, abdominal pain, dysuria, nausea, vomiting, hematuria, diarrhea.   Social history: She lives alone. She smokes 1 ppd. She denies etoh, recreational drug use. She is retired, and formerly worked as Pension scheme manager.   ROS: Constitutional: no weight change, no fever ENT/Mouth: no sore throat, no rhinorrhea Eyes: no eye pain, no vision changes Cardiovascular: no chest pain, no dyspnea,  no edema, no palpitations Respiratory: no cough, no sputum, no wheezing Gastrointestinal: no nausea, no vomiting, no diarrhea, no constipation Genitourinary: no urinary incontinence, no dysuria, no  hematuria Musculoskeletal: no arthralgias, no myalgias Skin: no skin lesions, no pruritus, Neuro: + weakness, no loss of consciousness, no syncope Psych: no anxiety, no depression, + decrease appetite Heme/Lymph: no bruising, no bleeding  ED Course: Discussed with emergency medicine provider, patient requiring hospitalization for chief concerns of right femoral neck fracture.   Assessment/Plan  Principal Problem:   Right femoral fracture (HCC) Active Problems:   Tobacco smoker, 1 pack of cigarettes or less per day   Essential hypertension   Hyperlipidemia   Leukocytosis   Assessment and Plan:  * Right femoral fracture (HCC) - Fall precaution - Hydrocodone-acetaminophen 5-325 mg p.o. every 6 hours as needed for moderate pain, 3 doses ordered - Morphine 1 mg IV every 2 hours as needed for severe pain - Orthopedic service has been consulted - N.p.o. after midnight - Admit to MedSurg, inpatient  Leukocytosis - Suspect reactive secondary to right femoral fracture - Leukocytosis patient for infectious etiology due to negative symptoms on review of system - CBC in a.m.  Hyperlipidemia - Rosuvastatin 10 mg nightly resumed  Essential hypertension - Resumed home amlodipine 5 mg daily, losartan 100 mg daily, metoprolol succinate 50 mg daily - Hydralazine 5 mg IV every 8 hours as needed for SBP greater 175, 4 days ordered  Tobacco smoker, 1 pack of cigarettes or less per day - Patient states she is not ready to quit smoking - Prn nicotine patch ordered  Chart reviewed.   DVT prophylaxis: TED hose, a.m. team to initiate pharmacologic DVT prophylaxis when the benefits outweigh the risk Code Status: Full code Diet: Regular diet; n.p.o. at midnight Family Communication: A phone call was  offered, patient declined stating that her daughter he knows she is in the hospital Disposition Plan: Pending clinical course Consults called: Orthopedic service Admission status: MedSurg,  inpatient  Past Medical History:  Diagnosis Date   Hypertension    Past Surgical History:  Procedure Laterality Date   CESAREAN SECTION     Social History:  reports that she has been smoking cigarettes. She has been smoking an average of 1 pack per day. She has never used smokeless tobacco. She reports that she does not drink alcohol and does not use drugs.  No Known Allergies Family History  Problem Relation Age of Onset   Cancer Mother    Family history: Family history reviewed and not pertinent  Prior to Admission medications   Amlodipine 5 mg daily   Physical Exam: Vitals:   06/04/22 1626 06/04/22 1629 06/04/22 1630  BP:  (!) 184/24 (!) 174/77  Pulse:  69 72  Resp:  20 10  Temp:  (!) 97.5 F (36.4 C)   TempSrc:  Oral   SpO2:  99% 99%  Weight: 43.5 kg    Height: 5' 6"$  (1.676 m)     Constitutional: appears age-appropriate, frail, NAD, calm, comfortable Eyes: PERRL, lids and conjunctivae normal ENMT: Mucous membranes are moist. Posterior pharynx clear of any exudate or lesions. Age-appropriate dentition. Hearing appropriate Neck: normal, supple, no masses, no thyromegaly Respiratory: clear to auscultation bilaterally, no wheezing, no crackles. Normal respiratory effort. No accessory muscle use.  Cardiovascular: Regular rate and rhythm, no murmurs / rubs / gallops. No extremity edema. 2+ pedal pulses. No carotid bruits.  Abdomen: no tenderness, no masses palpated, no hepatosplenomegaly. Bowel sounds positive.  Musculoskeletal: no clubbing / cyanosis. No joint deformity upper and lower extremities.  Decreased range of motion of the right lower extremity. no contractures, no atrophy. Normal muscle tone. Pain at the right hip Skin: no rashes, lesions, ulcers. No induration Neurologic: Sensation intact. Strength 5/5 in all 4.  Psychiatric: Normal judgment and insight. Alert and oriented x 3. Normal mood.   EKG: Not indicated at this time  Chest x-ray on Admission: I  personally reviewed and I agree with radiologist reading as below.  DG Chest Port 1 View  Result Date: 06/04/2022 CLINICAL DATA:  Ground level fall.  Right hip fracture. EXAM: PORTABLE CHEST 1 VIEW COMPARISON:  None Available. FINDINGS: Cardiac silhouette normal in size.  No mediastinal or hilar masses. Clear lungs. No gross pleural effusion or pneumothorax on this supine study. Skeletal structures are grossly intact. IMPRESSION: No acute cardiopulmonary disease. Electronically Signed   By: Lajean Manes M.D.   On: 06/04/2022 17:22   DG Hip Unilat W or Wo Pelvis 2-3 Views Right  Result Date: 06/04/2022 CLINICAL DATA:  Ground level fall.  Right hip pain. EXAM: DG HIP (WITH OR WITHOUT PELVIS) 2-3V RIGHT COMPARISON:  None Available. FINDINGS: Right femoral neck fracture, mid cervical, non comminuted, mildly displaced with varus angulation. No other fractures.  No bone lesions. Hip joints, SI joints and pubic symphysis are normally spaced and aligned. Scattered iliofemoral arterial vascular calcifications. IMPRESSION: 1. Mildly displaced, varus angulated right femoral neck fracture. No dislocation. Electronically Signed   By: Lajean Manes M.D.   On: 06/04/2022 17:21    Labs on Admission: I have personally reviewed following labs  CBC: Recent Labs  Lab 06/04/22 1739  WBC 12.1*  HGB 12.4  HCT 37.3  MCV 95.9  PLT [id number]   Basic Metabolic Panel: Recent Labs  Lab 06/04/22 1739  NA 137  K 3.6  CL 98  CO2 29  GLUCOSE 126*  BUN 17  CREATININE 0.86  CALCIUM 8.9   GFR: Estimated Creatinine Clearance: 37.6 mL/min (by C-G formula based on SCr of 0.86 mg/dL).  This document was prepared using Dragon Voice Recognition software and may include unintentional dictation errors.  Dr. Tobie Poet [facility]  If 7PM-7AM, please contact overnight-coverage provider If 7AM-7PM, please contact day coverage provider [email or web address]  06/04/2022, 7:25 PM

## 2022-06-04 NOTE — Consult Note (Signed)
ORTHOPAEDIC CONSULTATION  REQUESTING PHYSICIAN: Emeterio Reeve, DO  Chief Complaint: right hip pain  HPI: Veronica Lucas is a 77 y.o. female who complains of right hip pain after a fall. The pain is sharp in character. The pain is severe and 10/10. The pain is worse with movement and better with rest. Denies any numbness, tingling or constitutional symptoms.  Past Medical History:  Diagnosis Date   Hypertension    Past Surgical History:  Procedure Laterality Date   CESAREAN SECTION     Social History   Socioeconomic History   Marital status: Married    Spouse name: Not on file   Number of children: Not on file   Years of education: Not on file   Highest education level: Not on file  Occupational History   Not on file  Tobacco Use   Smoking status: Every Day    Packs/day: 1.00    Types: Cigarettes   Smokeless tobacco: Never  Substance and Sexual Activity   Alcohol use: Never   Drug use: Never   Sexual activity: Not Currently  Other Topics Concern   Not on file  Social History Narrative   Not on file   Social Determinants of Health   Financial Resource Strain: Not on file  Food Insecurity: Not on file  Transportation Needs: Not on file  Physical Activity: Not on file  Stress: Not on file  Social Connections: Not on file   Family History  Problem Relation Age of Onset   Cancer Mother    No Known Allergies Prior to Admission medications   Medication Sig Start Date End Date Taking? Authorizing Provider  alendronate (FOSAMAX) 70 MG tablet Take 70 mg by mouth once a week. Take with a full glass of water on an empty stomach.   Yes [provider]  amLODipine (NORVASC) 5 MG tablet Take 5 mg by mouth daily.   Yes [provider]  aspirin EC 81 MG tablet Take 81 mg by mouth daily. Swallow whole.   Yes [provider]  hydrochlorothiazide (MICROZIDE) 12.5 MG capsule Take 12.5 mg by mouth daily.   Yes [provider]  loratadine  (CLARITIN) 10 MG tablet Take 10 mg by mouth daily.   Yes [provider]  losartan (COZAAR) 100 MG tablet Take 100 mg by mouth daily.   Yes [provider]  metoprolol succinate (TOPROL-XL) 50 MG 24 hr tablet Take 50 mg by mouth daily.   Yes [provider]  rosuvastatin (CRESTOR) 10 MG tablet Take 10 mg by mouth daily.   Yes [provider]   DG Chest Port 1 View  Result Date: 06/04/2022 CLINICAL DATA:  Ground level fall.  Right hip fracture. EXAM: PORTABLE CHEST 1 VIEW COMPARISON:  None Available. FINDINGS: Cardiac silhouette normal in size.  No mediastinal or hilar masses. Clear lungs. No gross pleural effusion or pneumothorax on this supine study. Skeletal structures are grossly intact. IMPRESSION: No acute cardiopulmonary disease. Electronically Signed   By: Lajean Manes M.D.   On: 06/04/2022 17:22   DG Hip Unilat W or Wo Pelvis 2-3 Views Right  Result Date: 06/04/2022 CLINICAL DATA:  Ground level fall.  Right hip pain. EXAM: DG HIP (WITH OR WITHOUT PELVIS) 2-3V RIGHT COMPARISON:  None Available. FINDINGS: Right femoral neck fracture, mid cervical, non comminuted, mildly displaced with varus angulation. No other fractures.  No bone lesions. Hip joints, SI joints and pubic symphysis are normally spaced and aligned. Scattered iliofemoral arterial vascular calcifications. IMPRESSION:  1. Mildly displaced, varus angulated right femoral neck fracture. No dislocation. Electronically Signed   By: Lajean Manes M.D.   On: 06/04/2022 17:21    Positive ROS: All other systems have been reviewed and were otherwise negative with the exception of those mentioned in the HPI and as above.  Physical Exam: General: Alert, no acute distress Cardiovascular: No pedal edema Respiratory: No cyanosis, no use of accessory musculature GI: No organomegaly, abdomen is soft and non-tender Skin: No lesions in the area of chief complaint Neurologic: Sensation intact  distally Psychiatric: Patient is competent for consent with normal mood and affect Lymphatic: No axillary or cervical lymphadenopathy  MUSCULOSKELETAL: right leg short, externally rotated, pain with IR/ER. Compartments soft. Good cap refill. Motor and sensory intact distally.  Assessment: Right femoral neck fracture, closed, displaced  Plan: Plan a right hip hemiarthroplasty.  The diagnosis, risks, benefits and alternatives to treatment are all discussed in detail with the patient and family. Risks include but are not limited to bleeding, infection, deep vein thrombosis, pulmonary embolism, nerve or vascular injury, non-union, repeat operation, persistent pain, weakness, stiffness and death. She understands and is eager to proceed.     Lovell Sheehan, MD    06/05/2022 11:20 AM

## 2022-06-04 NOTE — ED Triage Notes (Signed)
Per ems, pt was at home and had a ground level fall, complaining of R hip pain with movement, pelvic binder in place, denies hitting head, no loc, no blood thinners  75 mcg fentanyl 4 mg zofran 150 cc NS  22 G in R forearm  Resp even and unalb, a&0x4

## 2022-06-04 NOTE — ED Notes (Addendum)
Pt covid swabbed and sent at this time. Planning admission .

## 2022-06-04 NOTE — ED Notes (Signed)
Lab called for repeat draw on INR. Lab coming to draw

## 2022-06-04 NOTE — Assessment & Plan Note (Addendum)
-   Patient states she is not ready to quit smoking - Prn nicotine patch ordered

## 2022-06-04 NOTE — Assessment & Plan Note (Signed)
-   Resumed home amlodipine 5 mg daily, losartan 100 mg daily, metoprolol succinate 50 mg daily - Hydralazine 5 mg IV every 8 hours as needed for SBP greater 175, 4 days ordered

## 2022-06-05 ENCOUNTER — Other Ambulatory Visit: Payer: Self-pay

## 2022-06-05 ENCOUNTER — Inpatient Hospital Stay: Payer: Medicare Other | Admitting: Anesthesiology

## 2022-06-05 ENCOUNTER — Inpatient Hospital Stay: Payer: Medicare Other

## 2022-06-05 ENCOUNTER — Encounter
Admission: EM | Disposition: A | Discharge status: Home Health | Payer: Self-pay | Source: Home / Self Care | Attending: Internal Medicine

## 2022-06-05 DIAGNOSIS — E782 Mixed hyperlipidemia: Secondary | ICD-10-CM

## 2022-06-05 DIAGNOSIS — F17210 Nicotine dependence, cigarettes, uncomplicated: Secondary | ICD-10-CM

## 2022-06-05 DIAGNOSIS — I10 Essential (primary) hypertension: Secondary | ICD-10-CM

## 2022-06-05 DIAGNOSIS — D72829 Elevated white blood cell count, unspecified: Secondary | ICD-10-CM

## 2022-06-05 HISTORY — PX: ANTERIOR APPROACH HEMI HIP ARTHROPLASTY: SHX6690

## 2022-06-05 LAB — CBC
HCT: 34.6 % — ABNORMAL LOW (ref 36.0–46.0)
Hemoglobin: 11.8 g/dL — ABNORMAL LOW (ref 12.0–15.0)
MCH: 32.2 pg (ref 26.0–34.0)
MCHC: 34.1 g/dL (ref 30.0–36.0)
MCV: 94.3 fL (ref 80.0–100.0)
Platelets: 274 10*3/uL (ref 150–400)
RBC: 3.67 MIL/uL — ABNORMAL LOW (ref 3.87–5.11)
RDW: 13.9 % (ref 11.5–15.5)
WBC: 11.5 10*3/uL — ABNORMAL HIGH (ref 4.0–10.5)
nRBC: 0 % (ref 0.0–0.2)

## 2022-06-05 LAB — BASIC METABOLIC PANEL
Anion gap: 7 (ref 5–15)
BUN: 14 mg/dL (ref 8–23)
CO2: 25 mmol/L (ref 22–32)
Calcium: 8.8 mg/dL — ABNORMAL LOW (ref 8.9–10.3)
Chloride: 103 mmol/L (ref 98–111)
Creatinine, Ser: 0.68 mg/dL (ref 0.44–1.00)
GFR, Estimated: >60 mL/min (ref >60)
Glucose, Bld: 113 mg/dL — ABNORMAL HIGH (ref 70–99)
Potassium: 3.6 mmol/L (ref 3.5–5.1)
Sodium: 135 mmol/L (ref 135–145)

## 2022-06-05 SURGERY — HEMIARTHROPLASTY, HIP, DIRECT ANTERIOR APPROACH, FOR FRACTURE
Anesthesia: General | Laterality: Right

## 2022-06-05 MED ORDER — ONDANSETRON HCL 4 MG/2ML IJ SOLN: 4.0000 mg | Freq: Four times a day (QID) | INTRAMUSCULAR | Status: DC | PRN

## 2022-06-05 MED ORDER — FENTANYL CITRATE (PF) 100 MCG/2ML IJ SOLN
25.0000 ug | INTRAMUSCULAR | Status: DC | PRN
  Administered 2022-06-05 (×2): 25 ug via INTRAVENOUS

## 2022-06-05 MED ORDER — 0.9 % SODIUM CHLORIDE (POUR BTL) OPTIME
TOPICAL | Status: DC | PRN
  Administered 2022-06-05: 1000 mL

## 2022-06-05 MED ORDER — METOCLOPRAMIDE HCL 5 MG/ML IJ SOLN: 5.0000 mg | Freq: Three times a day (TID) | INTRAMUSCULAR | Status: DC | PRN

## 2022-06-05 MED ORDER — ACETAMINOPHEN 10 MG/ML IV SOLN
INTRAVENOUS | Status: AC
  Filled 2022-06-05: qty 100

## 2022-06-05 MED ORDER — CEFAZOLIN SODIUM-DEXTROSE 2-4 GM/100ML-% IV SOLN
INTRAVENOUS | Status: AC
  Filled 2022-06-05: qty 100

## 2022-06-05 MED ORDER — FENTANYL CITRATE (PF) 100 MCG/2ML IJ SOLN
INTRAMUSCULAR | Status: AC
  Filled 2022-06-05: qty 2

## 2022-06-05 MED ORDER — LACTATED RINGERS IV SOLN: INTRAVENOUS | Status: DC | PRN

## 2022-06-05 MED ORDER — MENTHOL 3 MG MT LOZG: 1.0000 | LOZENGE | OROMUCOSAL | Status: DC | PRN

## 2022-06-05 MED ORDER — TRANEXAMIC ACID-NACL 1000-0.7 MG/100ML-% IV SOLN
1000.0000 mg | INTRAVENOUS | Status: AC
  Filled 2022-06-05: qty 100

## 2022-06-05 MED ORDER — ROCURONIUM BROMIDE 100 MG/10ML IV SOLN
INTRAVENOUS | Status: DC | PRN
  Administered 2022-06-05 (×2): 50 mg via INTRAVENOUS

## 2022-06-05 MED ORDER — BUPIVACAINE-EPINEPHRINE (PF) 0.25% -1:200000 IJ SOLN
INTRAMUSCULAR | Status: DC | PRN
  Administered 2022-06-05: 30 mL via PERINEURAL

## 2022-06-05 MED ORDER — METHOCARBAMOL 500 MG PO TABS: 500.0000 mg | ORAL_TABLET | Freq: Four times a day (QID) | ORAL | Status: DC | PRN

## 2022-06-05 MED ORDER — ZOLPIDEM TARTRATE 5 MG PO TABS: 5.0000 mg | ORAL_TABLET | Freq: Every evening | ORAL | Status: DC | PRN

## 2022-06-05 MED ORDER — OXYCODONE HCL 5 MG PO TABS: 5.0000 mg | ORAL_TABLET | Freq: Once | ORAL | Status: DC | PRN

## 2022-06-05 MED ORDER — LIDOCAINE HCL (CARDIAC) PF 100 MG/5ML IV SOSY
PREFILLED_SYRINGE | INTRAVENOUS | Status: DC | PRN
  Administered 2022-06-05: 100 mg via INTRAVENOUS

## 2022-06-05 MED ORDER — METOCLOPRAMIDE HCL 5 MG PO TABS: 5.0000 mg | ORAL_TABLET | Freq: Three times a day (TID) | ORAL | Status: DC | PRN

## 2022-06-05 MED ORDER — BISACODYL 10 MG RE SUPP: 10.0000 mg | Freq: Every day | RECTAL | Status: DC | PRN

## 2022-06-05 MED ORDER — FENTANYL CITRATE (PF) 100 MCG/2ML IJ SOLN
INTRAMUSCULAR | Status: DC | PRN
  Administered 2022-06-05: 25 ug via INTRAVENOUS
  Administered 2022-06-05 (×2): 50 ug via INTRAVENOUS
  Administered 2022-06-05: 25 ug via INTRAVENOUS
  Administered 2022-06-05: 50 ug via INTRAVENOUS

## 2022-06-05 MED ORDER — KETOROLAC TROMETHAMINE 15 MG/ML IJ SOLN
INTRAMUSCULAR | Status: AC
  Filled 2022-06-05: qty 1

## 2022-06-05 MED ORDER — CEFAZOLIN SODIUM-DEXTROSE 1-4 GM/50ML-% IV SOLN
1.0000 g | Freq: Four times a day (QID) | INTRAVENOUS | Status: AC
  Administered 2022-06-05 (×2): 1 g via INTRAVENOUS
  Filled 2022-06-05 (×2): qty 50

## 2022-06-05 MED ORDER — ONDANSETRON HCL 4 MG/2ML IJ SOLN
INTRAMUSCULAR | Status: DC | PRN
  Administered 2022-06-05: 4 mg via INTRAVENOUS

## 2022-06-05 MED ORDER — ONDANSETRON HCL 4 MG PO TABS: 4.0000 mg | ORAL_TABLET | Freq: Four times a day (QID) | ORAL | Status: DC | PRN

## 2022-06-05 MED ORDER — PROPOFOL 10 MG/ML IV BOLUS
INTRAVENOUS | Status: AC
  Filled 2022-06-05: qty 20

## 2022-06-05 MED ORDER — DEXAMETHASONE SODIUM PHOSPHATE 10 MG/ML IJ SOLN
INTRAMUSCULAR | Status: AC
  Filled 2022-06-05: qty 1

## 2022-06-05 MED ORDER — DOCUSATE SODIUM 100 MG PO CAPS
100.0000 mg | ORAL_CAPSULE | Freq: Two times a day (BID) | ORAL | Status: DC
  Administered 2022-06-05 – 2022-06-06 (×2): 100 mg via ORAL
  Filled 2022-06-05 (×2): qty 1

## 2022-06-05 MED ORDER — CEFAZOLIN SODIUM-DEXTROSE 2-4 GM/100ML-% IV SOLN
2.0000 g | INTRAVENOUS | Status: AC
  Administered 2022-06-05: 2 g via INTRAVENOUS

## 2022-06-05 MED ORDER — TRANEXAMIC ACID-NACL 1000-0.7 MG/100ML-% IV SOLN
INTRAVENOUS | Status: DC | PRN
  Administered 2022-06-05: 1000 mg via INTRAVENOUS

## 2022-06-05 MED ORDER — DIPHENHYDRAMINE HCL 12.5 MG/5ML PO ELIX: 12.5000 mg | ORAL_SOLUTION | ORAL | Status: DC | PRN

## 2022-06-05 MED ORDER — MAGNESIUM HYDROXIDE 400 MG/5ML PO SUSP: 30.0000 mL | Freq: Every day | ORAL | Status: DC | PRN

## 2022-06-05 MED ORDER — PHENYLEPHRINE HCL (PRESSORS) 10 MG/ML IV SOLN
INTRAVENOUS | Status: DC | PRN
  Administered 2022-06-05 (×2): 80 ug via INTRAVENOUS
  Administered 2022-06-05: 40 ug via INTRAVENOUS
  Administered 2022-06-05 (×2): 80 ug via INTRAVENOUS
  Administered 2022-06-05: 120 ug via INTRAVENOUS
  Administered 2022-06-05: 40 ug via INTRAVENOUS
  Administered 2022-06-05 (×2): 80 ug via INTRAVENOUS
  Administered 2022-06-05: 40 ug via INTRAVENOUS

## 2022-06-05 MED ORDER — PROPOFOL 10 MG/ML IV BOLUS
INTRAVENOUS | Status: DC | PRN
  Administered 2022-06-05: 120 mg via INTRAVENOUS

## 2022-06-05 MED ORDER — OXYCODONE HCL 5 MG/5ML PO SOLN: 5.0000 mg | Freq: Once | ORAL | Status: DC | PRN

## 2022-06-05 MED ORDER — IRRISEPT - 450ML BOTTLE WITH 0.05% CHG IN STERILE WATER, USP 99.95% OPTIME
TOPICAL | Status: DC | PRN
  Administered 2022-06-05: 450 mL

## 2022-06-05 MED ORDER — SUGAMMADEX SODIUM 200 MG/2ML IV SOLN
INTRAVENOUS | Status: DC | PRN
  Administered 2022-06-05: 200 mg via INTRAVENOUS

## 2022-06-05 MED ORDER — DEXAMETHASONE SODIUM PHOSPHATE 10 MG/ML IJ SOLN
INTRAMUSCULAR | Status: DC | PRN
  Administered 2022-06-05: 10 mg via INTRAVENOUS

## 2022-06-05 MED ORDER — SODIUM CHLORIDE 0.9 % IR SOLN
Status: DC | PRN
  Administered 2022-06-05: 3000 mL

## 2022-06-05 MED ORDER — PHENOL 1.4 % MT LIQD: 1.0000 | OROMUCOSAL | Status: DC | PRN

## 2022-06-05 MED ORDER — ACETAMINOPHEN 10 MG/ML IV SOLN
1000.0000 mg | Freq: Once | INTRAVENOUS | Status: AC
  Administered 2022-06-05: 1000 mg via INTRAVENOUS

## 2022-06-05 MED ORDER — LACTATED RINGERS IV SOLN: INTRAVENOUS | Status: DC

## 2022-06-05 MED ORDER — ASPIRIN 81 MG PO CHEW
81.0000 mg | CHEWABLE_TABLET | Freq: Two times a day (BID) | ORAL | Status: DC
  Administered 2022-06-05 – 2022-06-06 (×2): 81 mg via ORAL
  Filled 2022-06-05 (×2): qty 1

## 2022-06-05 MED ORDER — ALUM & MAG HYDROXIDE-SIMETH 200-200-20 MG/5ML PO SUSP: 30.0000 mL | ORAL | Status: DC | PRN

## 2022-06-05 MED ORDER — METHOCARBAMOL 1000 MG/10ML IJ SOLN: 500.0000 mg | Freq: Four times a day (QID) | INTRAVENOUS | Status: DC | PRN

## 2022-06-05 MED ORDER — MAGNESIUM CITRATE PO SOLN: 1.0000 | Freq: Once | ORAL | Status: DC | PRN

## 2022-06-05 MED ORDER — ONDANSETRON HCL 4 MG/2ML IJ SOLN
INTRAMUSCULAR | Status: AC
  Filled 2022-06-05: qty 2

## 2022-06-05 MED ORDER — MIDAZOLAM HCL 2 MG/2ML IJ SOLN
INTRAMUSCULAR | Status: DC | PRN
  Administered 2022-06-05: 2 mg via INTRAVENOUS

## 2022-06-05 MED ORDER — KETOROLAC TROMETHAMINE 15 MG/ML IJ SOLN
15.0000 mg | Freq: Once | INTRAMUSCULAR | Status: AC
  Administered 2022-06-05: 15 mg via INTRAVENOUS

## 2022-06-05 MED ORDER — MIDAZOLAM HCL 2 MG/2ML IJ SOLN
INTRAMUSCULAR | Status: AC
  Filled 2022-06-05: qty 2

## 2022-06-05 SURGICAL SUPPLY — 54 items
BLADE SAGITTAL WIDE XTHICK NO (BLADE) ×1 IMPLANT
BNDG COHESIVE 4X5 TAN STRL LF (GAUZE/BANDAGES/DRESSINGS) IMPLANT
BRUSH SCRUB EZ  4% CHG (MISCELLANEOUS) ×1
BRUSH SCRUB EZ 4% CHG (MISCELLANEOUS) ×1 IMPLANT
CHLORAPREP W/TINT 26 (MISCELLANEOUS) ×2 IMPLANT
DRAPE 3/4 80X56 (DRAPES) ×1 IMPLANT
DRAPE C-ARM 42X72 X-RAY (DRAPES) ×1 IMPLANT
DRAPE STERI IOBAN 125X83 (DRAPES) IMPLANT
DRAPE U-SHAPE 47X51 STRL (DRAPES) ×1 IMPLANT
DRSG AQUACEL AG ADV 3.5X10 (GAUZE/BANDAGES/DRESSINGS) IMPLANT
DRSG AQUACEL AG ADV 3.5X14 (GAUZE/BANDAGES/DRESSINGS) IMPLANT
DRSG XEROFORM 1X8 (GAUZE/BANDAGES/DRESSINGS) IMPLANT
ELECT REM PT RETURN 9FT ADLT (ELECTROSURGICAL) ×1
ELECTRODE REM PT RTRN 9FT ADLT (ELECTROSURGICAL) ×1 IMPLANT
FEM HEAD COCR 28MM 3 (Orthopedic Implant) ×1 IMPLANT
FEMORAL HEAD COCR 28MM 3 (Orthopedic Implant) IMPLANT
GAUZE 4X4 16PLY ~~LOC~~+RFID DBL (SPONGE) ×1 IMPLANT
GAUZE XEROFORM 1X8 LF (GAUZE/BANDAGES/DRESSINGS) IMPLANT
GLOVE BIO SURGEON STRL SZ8 (GLOVE) ×2 IMPLANT
GLOVE BIOGEL PI IND STRL 8.5 (GLOVE) ×2 IMPLANT
GLOVE PI ORTHO PRO STRL SZ8 (GLOVE) ×2 IMPLANT
GOWN STRL REUS W/ TWL XL LVL3 (GOWN DISPOSABLE) ×2 IMPLANT
GOWN STRL REUS W/TWL XL LVL3 (GOWN DISPOSABLE) ×2
HEAD BIPOLAR 46MM (Hips) IMPLANT
HOOD PEEL AWAY T7 (MISCELLANEOUS) ×3 IMPLANT
IV NS 250ML (IV SOLUTION) ×1
IV NS 250ML BAXH (IV SOLUTION) IMPLANT
IV NS IRRIG 3000ML ARTHROMATIC (IV SOLUTION) ×1 IMPLANT
KIT PATIENT CARE HANA TABLE (KITS) ×1 IMPLANT
KIT TURNOVER CYSTO (KITS) ×1 IMPLANT
MANIFOLD NEPTUNE II (INSTRUMENTS) ×1 IMPLANT
MAT ABSORB  FLUID 56X50 GRAY (MISCELLANEOUS) ×1
MAT ABSORB FLUID 56X50 GRAY (MISCELLANEOUS) ×1 IMPLANT
NDL SAFETY ECLIP 18X1.5 (MISCELLANEOUS) IMPLANT
NDL SPNL 20GX3.5 QUINCKE YW (NEEDLE) ×1 IMPLANT
NEEDLE SPNL 20GX3.5 QUINCKE YW (NEEDLE) ×1 IMPLANT
PACK HIP PROSTHESIS (MISCELLANEOUS) ×1 IMPLANT
PADDING CAST BLEND 4X4 NS (MISCELLANEOUS) ×2 IMPLANT
PENCIL SMOKE EVACUATOR (MISCELLANEOUS) ×1 IMPLANT
PILLOW ABDUCTION MEDIUM (MISCELLANEOUS) ×1 IMPLANT
PULSAVAC PLUS IRRIG FAN TIP (DISPOSABLE) ×1
SOLUTION IRRIG SURGIPHOR (IV SOLUTION) ×1 IMPLANT
SPONGE T-LAP 18X18 ~~LOC~~+RFID (SPONGE) ×2 IMPLANT
STAPLER SKIN PROX 35W (STAPLE) ×1 IMPLANT
STEM STD COLLAR SZ5 POLARSTEM (Stem) IMPLANT
SUT BONE WAX W31G (SUTURE) ×1 IMPLANT
SUT DVC 2 QUILL PDO  T11 36X36 (SUTURE) ×1
SUT DVC 2 QUILL PDO T11 36X36 (SUTURE) ×1 IMPLANT
SUT VIC AB 2-0 CT1 18 (SUTURE) ×1 IMPLANT
SYR 30ML LL (SYRINGE) ×1 IMPLANT
TIP FAN IRRIG PULSAVAC PLUS (DISPOSABLE) ×1 IMPLANT
TRAP FLUID SMOKE EVACUATOR (MISCELLANEOUS) ×1 IMPLANT
WAND WEREWOLF FASTSEAL 6.0 (MISCELLANEOUS) ×1 IMPLANT
WATER STERILE IRR 500ML POUR (IV SOLUTION) ×1 IMPLANT

## 2022-06-05 NOTE — ED Notes (Signed)
IV does not pull. Straight stick patient to left forearm with no success. Called lab to obtain morning lab work. States they will come but not be able to be down here to obtain labs till around 0630

## 2022-06-05 NOTE — ED Notes (Signed)
right femoral neck fracture

## 2022-06-05 NOTE — Anesthesia Postprocedure Evaluation (Signed)
Anesthesia Post Note  Patient: Veronica Lucas  Procedure(s) Performed: ANTERIOR APPROACH HEMI HIP ARTHROPLASTY (Right)  Patient location during evaluation: PACU Anesthesia Type: General Level of consciousness: awake and alert Pain management: pain level controlled Vital Signs Assessment: post-procedure vital signs reviewed and stable Respiratory status: spontaneous breathing, nonlabored ventilation, respiratory function stable and patient connected to nasal cannula oxygen Cardiovascular status: blood pressure returned to baseline and stable Postop Assessment: no apparent nausea or vomiting Anesthetic complications: no   No notable events documented.   Last Vitals:  Vitals:   06/05/22 1500 06/05/22 1510  BP: 138/69   Pulse: 83 85  Resp: 12 15  Temp:    SpO2: 100% 100%    Last Pain:  Vitals:   06/05/22 1126  TempSrc:   PainSc: 3                  Ilene Qua

## 2022-06-05 NOTE — Transfer of Care (Signed)
Immediate Anesthesia Transfer of Care Note  Patient: Veronica Lucas  Procedure(s) Performed: ANTERIOR APPROACH HEMI HIP ARTHROPLASTY (Right)  Patient Location: PACU  Anesthesia Type:General  Level of Consciousness: awake, drowsy, and patient cooperative  Airway & Oxygen Therapy: Patient Spontanous Breathing and Patient connected to nasal cannula oxygen  Post-op Assessment: Report given to RN  Post vital signs: stable  Last Vitals:  Vitals Value Taken Time  BP 135/68 06/05/22 1435  Temp    Pulse 87 06/05/22 1438  Resp 19 06/05/22 1438  SpO2 99 % 06/05/22 1438  Vitals shown include unvalidated device data.  Last Pain:  Vitals:   06/05/22 1126  TempSrc:   PainSc: 3          Complications: No notable events documented.

## 2022-06-05 NOTE — Op Note (Signed)
06/05/2022  2:40 PM  PATIENT:  Manasa Laroche   MRN: [id number]  PRE-OPERATIVE DIAGNOSIS:  Displaced Subcapital fracture right hip   POST-OPERATIVE DIAGNOSIS: Same  Procedure: Right Hip Anterior Hip Hemiarthroplasty   Surgeon: Elyn Aquas. Harlow Mares, MD   Assist: None  Anesthesia: Spinal   EBL: 50 mL   Specimens: None   Drains: None   Components used: A size 5 Polarstem [employer], a 28 mm by -3 mm, 46 mm bipolar head    Description of the procedure in detail: After informed consent was obtained and the appropriate extremity marked in the pre-operative holding area, the patient was taken to the operating room and placed in the supine position on the fracture table. All pressure points were well padded and bilateral lower extremities were place in traction spars. The hip was prepped and draped in standard sterile fashion. A spinal anesthetic had been delivered by the anesthesia team. The skin and subcutaneous tissues were injected with a mixture of Marcaine with epinephrine for post-operative pain. A longitudinal incision approximately 10 cm in length was carried out from the anterior superior iliac spine to the greater trochanter. The tensor fascia was divided and blunt dissection was taken down to the level of the joint capsule. The lateral circumflex vessels were cauterized. Deep retractors were placed and a portion of the anterior capsule was excised. Using fluoroscopy the neck cut was planned and carried out with a sagittal saw. The head was passed from the field with use of a corkscrew and hip skid. Deep retractors were placed along the acetabulum and bony and soft tissue debris was removed.   Attention was then turned to the proximal femur. The leg was placed in extension and external rotation. The canal was opened and sequentially broached to a size 5. The trial components were placed and the hip relocated. The components were found to be in good position using fluoroscopy. The hip  was dislocated and the trial components removed. The final components were impacted in to position and the hip relocated. The final components were again check with fluoroscopy and found to be in good position. Hemostasis was achieved with electrocautery. The deep capsule was injected with Marcaine and epinephrine. The wound was irrigated with bacitracin laced normal saline and the tensor fascia closed with #2 Quill suture. The subcutaneous tissues were closed with 2-0 vicryl and staples for the skin. A sterile dressing was applied and an abduction pillow. Patient tolerated the procedure well and there were no apparent complication. Patient was taken to the recovery room in good condition.    Elyn Aquas. Harlow Mares, MD  06/05/2022 2:40 PM

## 2022-06-05 NOTE — Anesthesia Preprocedure Evaluation (Addendum)
Anesthesia Evaluation  Patient identified by MRN, date of birth, ID band Patient awake    Reviewed: Allergy & Precautions, NPO status , Patient's Chart, lab work & pertinent test results  History of Anesthesia Complications Negative for: history of anesthetic complications  Airway Mallampati: III  TM Distance: >3 FB Neck ROM: full    Dental  (+) Upper Dentures   Pulmonary Current Smoker Multiple lung nodules   Pulmonary exam normal        Cardiovascular hypertension, On Medications and On Home Beta Blockers + CAD and + Peripheral Vascular Disease  Normal cardiovascular exam     Neuro/Psych negative neurological ROS  negative psych ROS   GI/Hepatic negative GI ROS, Neg liver ROS,,,  Endo/Other  negative endocrine ROS    Renal/GU negative Renal ROS  negative genitourinary   Musculoskeletal   Abdominal   Peds  Hematology negative hematology ROS (+)   Anesthesia Other Findings Past Medical History: No date: Hypertension  Past Surgical History: No date: CESAREAN SECTION  BMI    Body Mass Index: 15.49 kg/m      Reproductive/Obstetrics negative OB ROS                             Anesthesia Physical Anesthesia Plan  ASA: 2  Anesthesia Plan: General ETT   Post-op Pain Management: Ofirmev IV (intra-op)* and Toradol IV (intra-op)*   Induction: Intravenous  PONV Risk Score and Plan: 2 and Ondansetron, Dexamethasone and Treatment may vary due to age or medical condition  Airway Management Planned: Oral ETT  Additional Equipment:   Intra-op Plan:   Post-operative Plan: Extubation in OR  Informed Consent: I have reviewed the patients History and Physical, chart, labs and discussed the procedure including the risks, benefits and alternatives for the proposed anesthesia with the patient or authorized representative who has indicated his/her understanding and acceptance.      Dental Advisory Given  Plan Discussed with: Anesthesiologist, CRNA and Surgeon  Anesthesia Plan Comments: (Patient consented for risks of anesthesia including but not limited to:  - adverse reactions to medications - damage to eyes, teeth, lips or other oral mucosa - nerve damage due to positioning  - sore throat or hoarseness - Damage to heart, brain, nerves, lungs, other parts of body or loss of life  Patient voiced understanding.)       Anesthesia Quick Evaluation

## 2022-06-05 NOTE — ED Notes (Signed)
Report given to Tara, RN.

## 2022-06-05 NOTE — Anesthesia Procedure Notes (Signed)
Procedure Name: Intubation Date/Time: 06/05/2022 12:59 PM  Performed by: Loree Fee, CRNAPre-anesthesia Checklist: Patient identified, Patient being monitored, Timeout performed, Emergency Drugs available and Suction available Patient Re-evaluated:Patient Re-evaluated prior to induction Oxygen Delivery Method: Circle system utilized Preoxygenation: Pre-oxygenation with 100% oxygen Induction Type: IV induction Ventilation: Mask ventilation without difficulty Laryngoscope Size: Mac and 3 Grade View: Grade I Tube type: Oral Tube size: 7.0 mm Number of attempts: 1 Airway Equipment and Method: Stylet Placement Confirmation: ETT inserted through vocal cords under direct vision, positive ETCO2 and breath sounds checked- equal and bilateral Secured at: 22 cm Tube secured with: Tape Dental Injury: Teeth and Oropharynx as per pre-operative assessment

## 2022-06-05 NOTE — Progress Notes (Signed)
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  02/10: VSS/hypertensive, WBC slightly up at 12.1, CXR no concerns, Right hip x-ray was (+)right femoral neck fracture. EDP consulted orthopedic surgery who states that the patient will go for a right hip hemiarthroplasty tomorrow. 02/11: pending surgery. PT/OT ordered for tomorrow.    Consultants:  Orthopedics   Procedures: Cindee Lame 06/05/22 R hemiarthroplasty hip]      ASSESSMENT & PLAN:   Principal Problem:   Right femoral fracture (HCC) Active Problems:   Tobacco smoker, 1 pack of cigarettes or less per day   Essential hypertension   Hyperlipidemia   Leukocytosis   Right femoral fracture ([place]) Orthopedics following, surgery planned for today - she is risk optimized, low risk for low risk procedure Fall precaution Hydrocodone-acetaminophen 5-325 mg p.o. every 6 hours as needed for moderate pain, 3 doses ordered. Morphine 1 mg IV every 2 hours as needed for severe pain   Leukocytosis Suspect reactive secondary to right femoral fracture CBC in a.m.   Hyperlipidemia Rosuvastatin 10 mg nightly resumed   Essential hypertension Resumed home amlodipine 5 mg daily, losartan 100 mg daily, metoprolol succinate 50 mg daily Hydralazine 5 mg IV every 8 hours as needed for SBP greater 175, 4 days ordered   Tobacco smoker, 1 pack of cigarettes or less per day Patient states she is not ready to quit smoking Prn nicotine patch ordered    DVT prophylaxis: TED hose pending surgery  Pertinent IV fluids/nutrition: no continuous IV fluids, NPO pending surgery   Central lines / invasive devices: none  Code Status: FULL CODE    Current Admission Status: inpatient  TOC needs / Dispo plan: likely will need SNF/HH - pt prefers to go back home if possible but she does live by herself so would need to be relatively ambulatory and have appropriate DME/help available, PT/PT to assess postop Barriers to discharge / significant pending items: surgery today              Subjective / Brief ROS:  Patient reports no concerns Denies CP/SOB.  Pain controlled.  Denies new weakness. .  Reports no concerns w/ urination/defecation.   Family Communication: pt declined call - she is in communication w/ her daughter     Objective Findings:  Vitals:   06/05/22 0700 06/05/22 0730 06/05/22 0800 06/05/22 1030  BP: 125/68 128/67 (!) 149/75 (!) 143/71  Pulse: 82 84 88 86  Resp: 17 14 20 17  $ Temp:    98.3 F (36.8 C)  TempSrc:    Oral  SpO2: 92% 94% 100% 95%  Weight:      Height:       No intake or output data in the 24 hours ending 06/05/22 1204 Filed Weights   06/04/22 1626  Weight: 43.5 kg    Examination:  Physical Exam Constitutional:      General: She is not in acute distress.    Appearance: Normal appearance.  Cardiovascular:     Rate and Rhythm: Normal rate and regular rhythm.     Heart sounds: Normal heart sounds.  Pulmonary:     Effort: Pulmonary effort is normal.     Breath sounds: Normal breath sounds.  Abdominal:     Palpations: Abdomen is soft.  Skin:    General: Skin is warm and dry.  Neurological:     General: No focal deficit present.     Mental Status: She is alert and oriented to person, place, and time.  Psychiatric:        Mood and Affect: Mood normal.        Behavior: Behavior normal.          Scheduled Medications:   amLODipine  5 mg Oral Daily   losartan  100 mg Oral Daily   metoprolol succinate  50 mg Oral Daily   rosuvastatin  10 mg Oral QHS    Continuous Infusions:   ceFAZolin (ANCEF) IV     tranexamic acid      PRN Medications:  hydrALAZINE,  HYDROcodone-acetaminophen, morphine injection, nicotine, senna-docusate  Antimicrobials from admission:  Anti-infectives (From admission, onward)    Start     Dose/Rate Route Frequency Ordered Stop   06/05/22 1121  ceFAZolin (ANCEF) IVPB 2g/100 mL premix        2 g 200 mL/hr over 30 Minutes Intravenous 30 min pre-op 06/05/22 1122             Data Reviewed:  I have personally reviewed the following...  CBC: Recent Labs  Lab 06/04/22 1739 06/05/22 0436  WBC 12.1* 11.5*  HGB 12.4 11.8*  HCT 37.3 34.6*  MCV 95.9 94.3  PLT 313 [id number]   Basic Metabolic Panel: Recent Labs  Lab 06/04/22 1739 06/05/22 0436  NA 137 135  K 3.6 3.6  CL 98 103  CO2 29 25  GLUCOSE 126* 113*  BUN 17 14  CREATININE 0.86 0.68  CALCIUM 8.9 8.8*   GFR: Estimated Creatinine Clearance: 40.4 mL/min (by C-G formula based on SCr of 0.68 mg/dL). Liver Function Tests: No results for input(s): "AST", "ALT", "ALKPHOS", "BILITOT", "PROT", "ALBUMIN" in the last 168 hours. No results for input(s): "LIPASE", "AMYLASE" in the last 168 hours. No results for input(s): "AMMONIA" in the last 168 hours. Coagulation Profile: Recent Labs  Lab 06/04/22 1840  INR 1.0   Cardiac Enzymes: No results for input(s): "CKTOTAL", "CKMB", "CKMBINDEX", "TROPONINI" in the last 168 hours. BNP (last 3 results) No results for input(s): "PROBNP" in the last 8760 hours. HbA1C: No results for input(s): "HGBA1C" in the last 72 hours. CBG: No results for input(s): "GLUCAP" in the last 168 hours. Lipid Profile: No results for input(s): "CHOL", "HDL", "LDLCALC", "TRIG", "CHOLHDL", "LDLDIRECT" in the last 72 hours. Thyroid Function Tests: No results for input(s): "TSH", "T4TOTAL", "FREET4", "T3FREE", "THYROIDAB" in the last 72 hours. Anemia Panel: No results for input(s): "VITAMINB12", "FOLATE", "FERRITIN", "TIBC", "IRON", "RETICCTPCT" in the last 72 hours. Most Recent Urinalysis On File:  No results found for: "COLORURINE",  "APPEARANCEUR", "LABSPEC", "PHURINE", "GLUCOSEU", "HGBUR", "BILIRUBINUR", "KETONESUR", "PROTEINUR", "UROBILINOGEN", "NITRITE", "LEUKOCYTESUR" Sepsis Labs: @LABRCNTIP$ (procalcitonin:4,lacticidven:4) Microbiology: No results found for this or any previous visit (from the past 240 hour(s)).    Radiology Studies last 3 days: DG Chest Port 1 View  Result Date: 06/04/2022 CLINICAL DATA:  Ground level fall.  Right hip fracture. EXAM: PORTABLE CHEST 1 VIEW COMPARISON:  None Available. FINDINGS: Cardiac silhouette normal in size.  No mediastinal or hilar masses. Clear lungs. No gross pleural effusion or pneumothorax on this supine study. Skeletal structures are grossly intact. IMPRESSION: No acute cardiopulmonary disease. Electronically Signed   By: Lajean Manes M.D.   On: 06/04/2022 17:22   DG Hip Unilat W or Wo Pelvis  2-3 Views Right  Result Date: 06/04/2022 CLINICAL DATA:  Ground level fall.  Right hip pain. EXAM: DG HIP (WITH OR WITHOUT PELVIS) 2-3V RIGHT COMPARISON:  None Available. FINDINGS: Right femoral neck fracture, mid cervical, non comminuted, mildly displaced with varus angulation. No other fractures.  No bone lesions. Hip joints, SI joints and pubic symphysis are normally spaced and aligned. Scattered iliofemoral arterial vascular calcifications. IMPRESSION: 1. Mildly displaced, varus angulated right femoral neck fracture. No dislocation. Electronically Signed   By: Lajean Manes M.D.   On: 06/04/2022 17:21             LOS: 1 day       Emeterio Reeve, DO [facility] 06/05/2022, 12:04 PM    Dictation software may have been used to generate the above note. Typos may occur and escape review in typed/dictated notes. Please contact Dr Sheppard Coil directly for clarity if needed.  Staff may message me via secure chat in [place]  but this may not receive an immediate response,  please page me for urgent matters!  If 7PM-7AM, please contact night  coverage [email or web address]

## 2022-06-06 ENCOUNTER — Telehealth: Payer: Self-pay | Admitting: Osteopathic Medicine

## 2022-06-06 ENCOUNTER — Encounter: Payer: Self-pay | Admitting: Orthopedic Surgery

## 2022-06-06 DIAGNOSIS — S72009A Fracture of unspecified part of neck of unspecified femur, initial encounter for closed fracture: Secondary | ICD-10-CM

## 2022-06-06 DIAGNOSIS — E440 Moderate protein-calorie malnutrition: Secondary | ICD-10-CM | POA: Insufficient documentation

## 2022-06-06 LAB — CBC
HCT: 33.9 % — ABNORMAL LOW (ref 36.0–46.0)
Hemoglobin: 11.3 g/dL — ABNORMAL LOW (ref 12.0–15.0)
MCH: 31.4 pg (ref 26.0–34.0)
MCHC: 33.3 g/dL (ref 30.0–36.0)
MCV: 94.2 fL (ref 80.0–100.0)
Platelets: 272 10*3/uL (ref 150–400)
RBC: 3.6 MIL/uL — ABNORMAL LOW (ref 3.87–5.11)
RDW: 13.9 % (ref 11.5–15.5)
WBC: 15.4 10*3/uL — ABNORMAL HIGH (ref 4.0–10.5)
nRBC: 0 % (ref 0.0–0.2)

## 2022-06-06 LAB — BASIC METABOLIC PANEL
Anion gap: 7 (ref 5–15)
BUN: 15 mg/dL (ref 8–23)
CO2: 27 mmol/L (ref 22–32)
Calcium: 8.9 mg/dL (ref 8.9–10.3)
Chloride: 99 mmol/L (ref 98–111)
Creatinine, Ser: 0.66 mg/dL (ref 0.44–1.00)
GFR, Estimated: >60 mL/min (ref >60)
Glucose, Bld: 134 mg/dL — ABNORMAL HIGH (ref 70–99)
Potassium: 3.8 mmol/L (ref 3.5–5.1)
Sodium: 133 mmol/L — ABNORMAL LOW (ref 135–145)

## 2022-06-06 MED ORDER — ADULT MULTIVITAMIN W/MINERALS CH
1.0000 | ORAL_TABLET | Freq: Every day | ORAL | Status: DC
  Administered 2022-06-06: 1 via ORAL
  Filled 2022-06-06: qty 1

## 2022-06-06 MED ORDER — OXYCODONE HCL 5 MG PO TABS: 5.0000 mg | ORAL_TABLET | Freq: Four times a day (QID) | ORAL | 0 refills | Status: AC | PRN

## 2022-06-06 MED ORDER — ENSURE ENLIVE PO LIQD
237.0000 mL | Freq: Two times a day (BID) | ORAL | Status: DC
  Administered 2022-06-06: 237 mL via ORAL

## 2022-06-06 MED ORDER — ASPIRIN 81 MG PO TBEC: 81.0000 mg | DELAYED_RELEASE_TABLET | Freq: Two times a day (BID) | ORAL | 0 refills | Status: AC

## 2022-06-06 NOTE — Evaluation (Signed)
Physical Therapy Evaluation Patient Details Name: Veronica Lucas MRN: [id number] DOB: 1946/01/09 Today's Date: 06/06/2022  History of Present Illness  Pt is a 77 y/o F admitted on 06/04/22 after presenting to the ED following a fall. Pt found to have a R femoral neck fx. Pt underwent R anterior hip hemiarthroplasty on 06/05/22. PMH: HTN, multiple lung nodules  Clinical Impression  Pt seen for PT evaluation with pt's daughter present. Prior to admission pt was independent, working, driving & living alone in a 1 level home with 3 steps without rails to enter. Provided pt with HEP & pt performed RLE strengthening exercises with instructional cuing for technique.  Pt is able to complete bed mobility with mod I, STS with supervision, and ambulation with RW with CGA<>supervision. Pt is mobilizing very well. Anticipate pt can practice stair negotiation during 2nd PT session. Recommend OPPT upon d/c.      Recommendations for follow up therapy are one component of a multi-disciplinary discharge planning process, led by the attending physician.  Recommendations may be updated based on patient status, additional functional criteria and insurance authorization.  Follow Up Recommendations Outpatient PT      Assistance Recommended at Discharge PRN  Patient can return home with the following  A little help with walking and/or transfers;A little help with bathing/dressing/bathroom;Assistance with cooking/housework;Assist for transportation;Help with stairs or ramp for entrance    Equipment Recommendations Rolling walker (2 wheels)  Recommendations for Other Services       Functional Status Assessment Patient has had a recent decline in their functional status and demonstrates the ability to make significant improvements in function in a reasonable and predictable amount of time.     Precautions / Restrictions Precautions Precautions: Fall Restrictions Weight Bearing Restrictions: Yes RLE Weight Bearing:  Weight bearing as tolerated      Mobility  Bed Mobility Overal bed mobility: Modified Independent Bed Mobility: Supine to Sit     Supine to sit: Modified independent (Device/Increase time), HOB elevated     General bed mobility comments: use of bed rails, extra time to complete    Transfers Overall transfer level: Needs assistance Equipment used: Rolling walker (2 wheels) Transfers: Sit to/from Stand Sit to Stand: Supervision           General transfer comment: demo & education re: safe hand placement, technique for STS with RW.    Ambulation/Gait Ambulation/Gait assistance: Supervision, Min guard Gait Distance (Feet): 175 Feet Assistive device: Rolling walker (2 wheels) Gait Pattern/deviations: Step-through pattern Gait velocity: slightly decreased     General Gait Details: Educated on proper use of AD during gait  Stairs            Wheelchair Mobility    Modified Rankin (Stroke Patients Only)       Balance Overall balance assessment: Needs assistance Sitting-balance support: Feet supported Sitting balance-Leahy Scale: Good     Standing balance support: Bilateral upper extremity supported, During functional activity Standing balance-Leahy Scale: Good                               Pertinent Vitals/Pain Pain Assessment Pain Assessment: Faces Faces Pain Scale: Hurts a little bit Pain Location: R hip Pain Descriptors / Indicators: Discomfort Pain Intervention(s): Monitored during session, Premedicated before session    Home Living Family/patient expects to be discharged to:: Private residence Living Arrangements: Alone Available Help at Discharge: Family Type of Home: House Home Access: Stairs to enter  Entrance Stairs-Rails: None Entrance Stairs-Number of Steps: 3   Home Layout: One level Home Equipment: None      Prior Function Prior Level of Function : Independent/Modified Independent;Driving;Working/employed              Mobility Comments: Works full time       Journalist, newspaper        Extremity/Trunk Assessment   Upper Extremity Assessment Upper Extremity Assessment: Overall WFL for tasks assessed    Lower Extremity Assessment Lower Extremity Assessment: RLE deficits/detail RLE Deficits / Details: 3/5 knee extension, 3/5 hip flexion in sitting    Cervical / Trunk Assessment Cervical / Trunk Assessment: Normal  Communication   Communication: No difficulties  Cognition Arousal/Alertness: Awake/alert Behavior During Therapy: WFL for tasks assessed/performed Overall Cognitive Status: Within Functional Limits for tasks assessed                                          General Comments      Exercises General Exercises - Lower Extremity Ankle Circles/Pumps: AROM, Right, 10 reps, Supine Quad Sets: AROM, Right, Strengthening, Supine, 10 reps Gluteal Sets: Strengthening, 10 reps, AROM, Supine Short Arc Quad: AROM, Supine, AAROM, Strengthening, 10 reps, Right Long Arc Quad: AROM, Seated, Right, Strengthening, 10 reps Heel Slides: AROM, Strengthening, Right, 10 reps, Supine Hip ABduction/ADduction: AROM, Supine, Strengthening, Right, 10 reps (hip abduction slides x 10 + hip adduction pillow squeezes x 10) Straight Leg Raises: AAROM, Supine, Right, Strengthening, 10 reps Hip Flexion/Marching: AROM, Seated, Strengthening, Right, 10 reps   Assessment/Plan    PT Assessment Patient needs continued PT services  PT Problem List Decreased strength;Decreased balance;Decreased knowledge of use of DME;Decreased activity tolerance;Decreased mobility;Decreased range of motion       PT Treatment Interventions Therapeutic exercise;DME instruction;Gait training;Balance training;Neuromuscular re-education;Stair training;Functional mobility training;Patient/family education;Therapeutic activities;Modalities    PT Goals (Current goals can be found in the Care Plan section)  Acute Rehab  PT Goals Patient Stated Goal: return to work PT Goal Formulation: With patient Time For Goal Achievement: 06/20/22 Potential to Achieve Goals: Good    Frequency BID     Co-evaluation               AM-PAC PT "6 Clicks" Mobility  Outcome Measure Help needed turning from your back to your side while in a flat bed without using bedrails?: None Help needed moving from lying on your back to sitting on the side of a flat bed without using bedrails?: A Little Help needed moving to and from a bed to a chair (including a wheelchair)?: A Little Help needed standing up from a chair using your arms (e.g., wheelchair or bedside chair)?: A Little Help needed to walk in hospital room?: A Little Help needed climbing 3-5 steps with a railing? : A Little 6 Click Score: 19    End of Session   Activity Tolerance: Patient tolerated treatment well Patient left: in chair;with call bell/phone within reach;with family/visitor present Nurse Communication: Mobility status PT Visit Diagnosis: Muscle weakness (generalized) (M62.81);Other abnormalities of gait and mobility (R26.89)    Time: [id number] PT Time Calculation (min) (ACUTE ONLY): 22 min   Charges:   PT Evaluation $PT Eval Low Complexity: 1 Low PT Treatments $Therapeutic Exercise: 8-22 mins        Lavone Nian, PT, DPT 06/06/22, 9:30 AM   Waunita Schooner 06/06/2022, 9:28 AM

## 2022-06-06 NOTE — Discharge Summary (Addendum)
Physician Discharge Summary   Patient: Veronica Lucas MRN: [id number]  DOB: 1945-07-31   Admit:     Date of Admission: 06/04/2022 Admitted from: home   Discharge: Date of discharge: 06/06/22 Disposition: Home health Condition at discharge: good  CODE STATUS: [address]      Discharge Physician: Emeterio Reeve, DO [facility]     PCP: Wardell Honour, MD  Recommendations for Outpatient Follow-up:  Follow up with PCP Wardell Honour, MD in 1-2 weeks Follow up with Orthopedics as directed  Please obtain labs/tests: consider CBC, BMP in 1-2 weeks Please follow up on the following pending results: none PCP AND OTHER OUTPATIENT PROVIDERS: SEE BELOW FOR SPECIFIC DISCHARGE INSTRUCTIONS PRINTED FOR PATIENT IN ADDITION TO GENERIC AVS PATIENT INFO    Discharge Instructions     Diet - low sodium heart healthy   Complete by: As directed    Increase activity slowly   Complete by: As directed          Discharge Diagnoses: Principal Problem:   Right femoral fracture ([place]) Active Problems:   Tobacco smoker, 1 pack of cigarettes or less per day   Essential hypertension   Hyperlipidemia   Leukocytosis   Malnutrition of moderate degree       Hospital Course: Translator Ms. Samirra Molinar is a 77 year old female with history of hypertension, multiple lung nodules, who presents emergency department for chief concerns of ground level fall.  02/10: VSS/hypertensive, WBC slightly up at 12.1, CXR no concerns, Right hip x-ray was (+)right femoral neck fracture. EDP consulted orthopedic surgery who states that the patient will go for a right hip hemiarthroplasty tomorrow. 02/11: underwent Right Hip Anterior Hip Hemiarthroplasty. PT/OT ordered for tomorrow.   02/12: doing well w/ PT and OT, ok to discharge per ortho   Consultants:  Orthopedics   Procedures: 06/05/22 Right Hip Anterior Hip Hemiarthroplasty       ASSESSMENT & PLAN:   Principal Problem:   Right  femoral fracture ([facility]) Active Problems:   Tobacco smoker, 1 pack of cigarettes or less per day   Essential hypertension   Hyperlipidemia   Leukocytosis   Right femoral fracture ([place]) Orthopedics following outpatient ASA 81 mg bid for DVT ppx  POD2 Right Hip Anterior Hip Hemiarthroplasty  Fall precaution   Leukocytosis Suspect reactive secondary to right femoral fracture CBC outpatient    Hyperlipidemia Rosuvastatin 10 mg nightly resumed   Essential hypertension Resumed home medications   Tobacco smoker, 1 pack of cigarettes or less per day Patient states she is not ready to quit smoking Prn nicotine patch ordered Encouraged quitting smoking            Discharge Instructions  Allergies as of 06/06/2022   No Known Allergies      Medication List     TAKE these medications    alendronate 70 MG tablet Commonly known as: FOSAMAX Take 70 mg by mouth once a week. Take with a full glass of water on an empty stomach.   amLODipine 5 MG tablet Commonly known as: NORVASC Take 5 mg by mouth daily.   aspirin EC 81 MG tablet Take 1 tablet (81 mg total) by mouth in the morning and at bedtime. Swallow whole. Twice daily dosing until follow up with orthopedics What changed:  when to take this additional instructions   hydrochlorothiazide 12.5 MG capsule Commonly known as: MICROZIDE Take 12.5 mg by mouth daily.   loratadine 10 MG tablet Commonly known as: CLARITIN Take  10 mg by mouth daily.   losartan 100 MG tablet Commonly known as: COZAAR Take 100 mg by mouth daily.   metoprolol succinate 50 MG 24 hr tablet Commonly known as: TOPROL-XL Take 50 mg by mouth daily.   oxyCODONE 5 MG immediate release tablet Commonly known as: Roxicodone Take 1 tablet (5 mg total) by mouth every 6 (six) hours as needed for severe pain.   rosuvastatin 10 MG tablet Commonly known as: CRESTOR Take 10 mg by mouth daily.               Durable Medical Equipment   (From admission, onward)           Start     Ordered   06/06/22 0922  For home use only DME Walker rolling  Once       Question Answer Comment  Walker: With [place] Wheels   Patient needs a walker to treat with the following condition General weakness      06/06/22 0921             Follow-up Information     Carlynn Spry, PA-C. Schedule an appointment as soon as possible for a visit in 44 day(s).   Specialty: Orthopedic Surgery Why: Staple removal Contact information: [place] [address] [phone]         Lovell Sheehan, MD Follow up.   Specialty: Orthopedic Surgery Why: Office will call patient to make follow-up appt Contact information: [place] [address] [phone]                 No Known Allergies   Subjective: pt feeling well this morning, pain is controlled    Discharge Exam: BP 132/70 (BP Location: Left Arm) Comment: Nurse Rhae Lerner notified  Pulse 81   Temp 97.6 F (36.4 C) (Oral)   Resp 17   Ht 5' 6"$  (1.676 m)   Wt 43.5 kg   SpO2 100%   BMI 15.49 kg/m  General: Pt is alert, awake, not in acute distress Cardiovascular: RRR, S1/S2 +murmur, no rubs, no gallops Respiratory: CTA bilaterally, no wheezing, no rhonchi Abdominal: Soft, NT, ND, bowel sounds + Extremities: no edema, no cyanosis     The results of significant diagnostics from this hospitalization (including imaging, microbiology, ancillary and laboratory) are listed below for reference.     Microbiology: No results found for this or any previous visit (from the past 240 hour(s)).   Labs: BNP (last 3 results) No results for input(s): "BNP" in the last 8760 hours. Basic Metabolic Panel: Recent Labs  Lab 06/04/22 1739 06/05/22 0436 06/06/22 0506  NA 137 135 133*  K 3.6 3.6 3.8  CL 98 103 99  CO2 29 25 27  $ GLUCOSE 126* 113* 134*  BUN 17 14 15  $ CREATININE 0.86 0.68 0.66  CALCIUM 8.9 8.8* 8.9   Liver Function  Tests: No results for input(s): "AST", "ALT", "ALKPHOS", "BILITOT", "PROT", "ALBUMIN" in the last 168 hours. No results for input(s): "LIPASE", "AMYLASE" in the last 168 hours. No results for input(s): "AMMONIA" in the last 168 hours. CBC: Recent Labs  Lab 06/04/22 1739 06/05/22 0436 06/06/22 0506  WBC 12.1* 11.5* 15.4*  HGB 12.4 11.8* 11.3*  HCT 37.3 34.6* 33.9*  MCV 95.9 94.3 94.2  PLT 313 274 272   Cardiac Enzymes: No results for input(s): "CKTOTAL", "CKMB", "CKMBINDEX", "TROPONINI" in the last 168 hours. BNP: Invalid input(s): "POCBNP" CBG: No results for input(s): "GLUCAP" in the  last 168 hours. D-Dimer No results for input(s): "DDIMER" in the last 72 hours. Hgb A1c No results for input(s): "HGBA1C" in the last 72 hours. Lipid Profile No results for input(s): "CHOL", "HDL", "LDLCALC", "TRIG", "CHOLHDL", "LDLDIRECT" in the last 72 hours. Thyroid function studies No results for input(s): "TSH", "T4TOTAL", "T3FREE", "THYROIDAB" in the last 72 hours.  Invalid input(s): "FREET3" Anemia work up No results for input(s): "VITAMINB12", "FOLATE", "FERRITIN", "TIBC", "IRON", "RETICCTPCT" in the last 72 hours. Urinalysis No results found for: "COLORURINE", "APPEARANCEUR", "LABSPEC", "PHURINE", "GLUCOSEU", "HGBUR", "BILIRUBINUR", "KETONESUR", "PROTEINUR", "UROBILINOGEN", "NITRITE", "LEUKOCYTESUR" Sepsis Labs Recent Labs  Lab 06/04/22 1739 06/05/22 0436 06/06/22 0506  WBC 12.1* 11.5* 15.4*   Microbiology No results found for this or any previous visit (from the past 240 hour(s)). Imaging DG HIP UNILAT WITH PELVIS 1V RIGHT  Result Date: 06/05/2022 CLINICAL DATA:  Postop right hip replacement. EXAM: DG HIP (WITH OR WITHOUT PELVIS) 1V RIGHT; DG C-ARM 1-60 MIN-NO REPORT COMPARISON:  06/04/2022. FINDINGS: Single AP view shows a right proximal femur hemiarthroplasty that appears well seated and aligned. IMPRESSION: Portable operative imaging provided for right hip arthroplasty.  Electronically Signed   By: Lajean Manes M.D.   On: 06/05/2022 14:34   DG C-Arm 1-60 Min-No Report  Result Date: 06/05/2022 Fluoroscopy was utilized by the requesting physician.  No radiographic interpretation.   DG Chest Port 1 View  Result Date: 06/04/2022 CLINICAL DATA:  Ground level fall.  Right hip fracture. EXAM: PORTABLE CHEST 1 VIEW COMPARISON:  None Available. FINDINGS: Cardiac silhouette normal in size.  No mediastinal or hilar masses. Clear lungs. No gross pleural effusion or pneumothorax on this supine study. Skeletal structures are grossly intact. IMPRESSION: No acute cardiopulmonary disease. Electronically Signed   By: Lajean Manes M.D.   On: 06/04/2022 17:22   DG Hip Unilat W or Wo Pelvis 2-3 Views Right  Result Date: 06/04/2022 CLINICAL DATA:  Ground level fall.  Right hip pain. EXAM: DG HIP (WITH OR WITHOUT PELVIS) 2-3V RIGHT COMPARISON:  None Available. FINDINGS: Right femoral neck fracture, mid cervical, non comminuted, mildly displaced with varus angulation. No other fractures.  No bone lesions. Hip joints, SI joints and pubic symphysis are normally spaced and aligned. Scattered iliofemoral arterial vascular calcifications. IMPRESSION: 1. Mildly displaced, varus angulated right femoral neck fracture. No dislocation. Electronically Signed   By: Lajean Manes M.D.   On: 06/04/2022 17:21      Time coordinating discharge: over 30 minutes  SIGNED:  Emeterio Reeve DO [facility]

## 2022-06-06 NOTE — Telephone Encounter (Signed)
Needs home health, cannot place order as pt was dc from hospital >2h ago

## 2022-06-06 NOTE — TOC Progression Note (Addendum)
Transition of Care [facility]) - Progression Note    Patient Details  Name: Veronica Lucas MRN: [id number] Date of Birth: Sep 17, 1945  Transition of Care [facility]) CM/SW [place], RN Phone Number: 06/06/2022, 9:51 AM  Clinical Narrative:     Damaris Schooner with Lynn Ito the patient's daughter, she stated that they would stay with the patient for a few days, [employer] to deliver a rolling walker to the patient's bedside to take home, I reached out to [place] to see if they can accept the patient for [facility] PT, they will let me know, The daughter stated understanding, her daughter will provide transportation  [place] responded that they could not do [place] until 2/15 I reached out to [place] with [facility] to see if they could accept and have a [place] sooner [facility] will be able to accept her  Expected Discharge Plan: [place] Barriers to Discharge: No Barriers Identified  Expected Discharge Plan and Services   Discharge Planning Services: CM Consult   Living arrangements for the past 2 months: Single Family Home                 DME Arranged: Walker rolling DME Agency: [employer] Date DME Agency Contacted: 06/06/22 Time DME Agency Contacted: [phone] Representative spoke with at DME Agency: [facility] HH Arranged: PT [place]: [place] Date [place]: 06/06/22 Time [place]: [phone] Representative spoke with at [place]: [place]   Social Determinants of Health ([place]) Interventions SDOH Screenings   Food Insecurity: No Food Insecurity (06/05/2022)  Housing: Low Risk  (06/05/2022)  Transportation Needs: No Transportation Needs (06/05/2022)  Utilities: Not At Risk (06/05/2022)  Tobacco Use: High Risk (06/06/2022)    Readmission Risk Interventions     No data to display

## 2022-06-06 NOTE — Progress Notes (Signed)
Physical Therapy Treatment Patient Details Name: Veronica Lucas MRN: [id number] DOB: [birth year] Today's Date: 06/06/2022   History of Present Illness Pt is a 77 y/o F admitted on 06/04/22 after presenting to the ED following a fall. Pt found to have a R femoral neck fx. Pt underwent R anterior hip hemiarthroplasty on 06/05/22. PMH: HTN, multiple lung nodules    PT Comments    Pt seen for PT tx with pt agreeable, daughter not in room. Pt is able to complete bed mobility with mod I, STS with supervision with questioning cuing to recall safe hand placement. Pt ambulates increased distances with RW & supervision & negotiates stairs backwards with RW & PT stabilizing AD. Pt is safe to d/c home from PT standpoint. Continue to recommend OPPT.    Recommendations for follow up therapy are one component of a multi-disciplinary discharge planning process, led by the attending physician.  Recommendations may be updated based on patient status, additional functional criteria and insurance authorization.  Follow Up Recommendations  Outpatient PT     Assistance Recommended at Discharge PRN  Patient can return home with the following Assistance with cooking/housework;Assist for transportation;Help with stairs or ramp for entrance;A little help with walking and/or transfers;A little help with bathing/dressing/bathroom   Equipment Recommendations  Rolling walker (2 wheels)    Recommendations for Other Services       Precautions / Restrictions Precautions Precautions: Fall Restrictions Weight Bearing Restrictions: Yes RLE Weight Bearing: Weight bearing as tolerated     Mobility  Bed Mobility Overal bed mobility: Modified Independent Bed Mobility: Supine to Sit, Sit to Supine     Supine to sit: Modified independent (Device/Increase time), HOB elevated Sit to supine: Modified independent (Device/Increase time), HOB elevated   General bed mobility comments: extra time to elevate RLE onto bed during  sit>supine    Transfers Overall transfer level: Needs assistance Equipment used: Rolling walker (2 wheels) Transfers: Sit to/from Stand Sit to Stand: Supervision           General transfer comment: questioning cuing to recall safe hand placement    Ambulation/Gait Ambulation/Gait assistance: Supervision Gait Distance (Feet):  (250 ft + 250 ft) Assistive device: Rolling walker (2 wheels) Gait Pattern/deviations: Step-through pattern Gait velocity: slightly decreased     General Gait Details: Educated on proper use of AD during gait   Stairs Stairs: Yes Stairs assistance: Min assist Stair Management: No rails, With walker, Backwards, Step to pattern Number of Stairs: 3 (+ 3) General stair comments: PT provides demonstration for stair negotiation with pt negotiating 3 steps + 3 steps backwards with RW & PT stabilizing RW. Pt able to recall step to pattern.   Wheelchair Mobility    Modified Rankin (Stroke Patients Only)       Balance Overall balance assessment: Needs assistance Sitting-balance support: Feet supported Sitting balance-Leahy Scale: Normal     Standing balance support: Bilateral upper extremity supported, During functional activity Standing balance-Leahy Scale: Good                              Cognition Arousal/Alertness: Awake/alert Behavior During Therapy: WFL for tasks assessed/performed Overall Cognitive Status: Within Functional Limits for tasks assessed                                          Exercises General Exercises -  Lower Extremity Ankle Circles/Pumps: AROM, Right, 10 reps, Supine Quad Sets: AROM, Right, Strengthening, Supine, 10 reps Gluteal Sets: Strengthening, 10 reps, AROM, Supine Short Arc Quad: AROM, Supine, AAROM, Strengthening, 10 reps, Right Long Arc Quad: AROM, Seated, Right, Strengthening, 10 reps Heel Slides: AROM, Strengthening, Right, 10 reps, Supine Hip ABduction/ADduction: AROM,  Supine, Strengthening, Right, 10 reps (hip abduction slides x 10 + hip adduction pillow squeezes x 10) Straight Leg Raises: AAROM, Supine, Right, Strengthening, 10 reps Hip Flexion/Marching: AROM, Seated, Strengthening, Right, 10 reps    General Comments General comments (skin integrity, edema, etc.): Provided pt with handout re: stair negotiation backwards with RW. Educated pt on use of walker bag to transport items.      Pertinent Vitals/Pain Pain Assessment Pain Assessment: Faces Faces Pain Scale: Hurts a little bit Pain Location: R hip Pain Descriptors / Indicators: Discomfort Pain Intervention(s): Monitored during session    Home Living Family/patient expects to be discharged to:: Private residence Living Arrangements: Alone Available Help at Discharge: Family;Available PRN/intermittently Type of Home: House Home Access: Stairs to enter Entrance Stairs-Rails: None Entrance Stairs-Number of Steps: 3   Home Layout: One level Home Equipment: None Additional Comments: Pt's daughter is available to stay with her this week if needed    Prior Function            PT Goals (current goals can now be found in the care plan section) Acute Rehab PT Goals Patient Stated Goal: return to work PT Goal Formulation: With patient Time For Goal Achievement: 06/20/22 Potential to Achieve Goals: Good Progress towards PT goals: Progressing toward goals    Frequency    BID      PT Plan Current plan remains appropriate    Co-evaluation              AM-PAC PT "6 Clicks" Mobility   Outcome Measure  Help needed turning from your back to your side while in a flat bed without using bedrails?: None Help needed moving from lying on your back to sitting on the side of a flat bed without using bedrails?: A Little Help needed moving to and from a bed to a chair (including a wheelchair)?: A Little Help needed standing up from a chair using your arms (e.g., wheelchair or bedside  chair)?: A Little Help needed to walk in hospital room?: A Little Help needed climbing 3-5 steps with a railing? : A Little 6 Click Score: 19    End of Session   Activity Tolerance: Patient tolerated treatment well Patient left: in bed;with call bell/phone within reach Nurse Communication: Mobility status PT Visit Diagnosis: Muscle weakness (generalized) (M62.81);Other abnormalities of gait and mobility (R26.89)     Time: 1141-1157 PT Time Calculation (min) (ACUTE ONLY): 16 min  Charges:  $Gait Training: 8-22 mins $Therapeutic Exercise: 8-22 mins                     Lavone Nian, PT, DPT 06/06/22, 12:10 PM   Waunita Schooner 06/06/2022, 12:09 PM

## 2022-06-06 NOTE — Plan of Care (Signed)
  Problem: Education: Goal: Knowledge of General Education information will improve Description: Including pain rating scale, medication(s)/side effects and non-pharmacologic comfort measures Outcome: Progressing   Problem: Health Behavior/Discharge Planning: Goal: Ability to manage health-related needs will improve Outcome: Progressing   Problem: Clinical Measurements: Goal: Ability to maintain clinical measurements within normal limits will improve Outcome: Progressing Goal: Will remain free from infection Outcome: Progressing Goal: Diagnostic test results will improve Outcome: Progressing Goal: Respiratory complications will improve Outcome: Progressing Goal: Cardiovascular complication will be avoided Outcome: Progressing   Problem: Activity: Goal: Risk for activity intolerance will decrease Outcome: Progressing   Problem: Nutrition: Goal: Adequate nutrition will be maintained Outcome: Progressing   Problem: Elimination: Goal: Will not experience complications related to bowel motility Outcome: Progressing   Problem: Safety: Goal: Ability to remain free from injury will improve Outcome: Progressing   Problem: Skin Integrity: Goal: Risk for impaired skin integrity will decrease Outcome: Progressing   Problem: Activity: Goal: Ability to avoid complications of mobility impairment will improve Outcome: Progressing Goal: Ability to tolerate increased activity will improve Outcome: Progressing   Problem: Clinical Measurements: Goal: Postoperative complications will be avoided or minimized Outcome: Progressing   Problem: Pain Management: Goal: Pain level will decrease with appropriate interventions Outcome: Progressing   Problem: Skin Integrity: Goal: Will show signs of wound healing Outcome: Progressing

## 2022-06-06 NOTE — Evaluation (Signed)
Occupational Therapy Evaluation Patient Details Name: Veronica Lucas MRN: [id number] DOB: 1945/07/28 Today's Date: 06/06/2022   History of Present Illness Pt is a 77 y/o F admitted on 06/04/22 after presenting to the ED following a fall. Pt found to have a R femoral neck fx. Pt underwent R anterior hip hemiarthroplasty on 06/05/22. PMH: HTN, multiple lung nodules   Clinical Impression   Ms. Silva presents with reduced ROM, impaired balance, and mild pain, s/p a mechanical fall, R femoral neck fx, and R anterior hip hemiarthroplasty. Pt lives alone in a single-story home, 3 STE. She has been IND in B/IADL, drives, works full time. She denies a falls history, other than fall that led to this hospitalization. During today's evaluation, pt reports 2/10 pain. She is able to perform bed mobility, ambulation with RW, toileting, grooming, LB dressing, all with Mod I- SUPV. Provided educ re: AE that may be useful during rehab, safe use of RW, car transfer technique, with pt verbalizing understanding. Pt's daughter is available to stay with pt PRN. Pt is moving well, with good balance, able to safely complete ADLs. No further OT services are necessary at this time. Recommend DC with RW, no BSC needed.    Recommendations for follow up therapy are one component of a multi-disciplinary discharge planning process, led by the attending physician.  Recommendations may be updated based on patient status, additional functional criteria and insurance authorization.   Follow Up Recommendations  No OT follow up     Assistance Recommended at Discharge Intermittent Supervision/Assistance  Patient can return home with the following A little help with bathing/dressing/bathroom;Assistance with cooking/housework;Assist for transportation    Functional Status Assessment  Patient has had a recent decline in their functional status and demonstrates the ability to make significant improvements in function in a reasonable and  predictable amount of time.  Equipment Recommendations  Other (comment) (RW)    Recommendations for Other Services       Precautions / Restrictions Precautions Precautions: Fall Restrictions Weight Bearing Restrictions: Yes RLE Weight Bearing: Weight bearing as tolerated      Mobility Bed Mobility Overal bed mobility: Modified Independent Bed Mobility: Supine to Sit, Sit to Supine     Supine to sit: Modified independent (Device/Increase time), HOB elevated Sit to supine: Modified independent (Device/Increase time), HOB elevated   General bed mobility comments: able to complete without use of bed rails    Transfers Overall transfer level: Needs assistance Equipment used: Rolling walker (2 wheels) Transfers: Sit to/from Stand Sit to Stand: Supervision                  Balance Overall balance assessment: Needs assistance Sitting-balance support: Feet supported Sitting balance-Leahy Scale: Normal     Standing balance support: Bilateral upper extremity supported, During functional activity Standing balance-Leahy Scale: Good                             ADL either performed or assessed with clinical judgement   ADL Overall ADL's : Needs assistance/impaired     Grooming: Wash/dry hands;Supervision/safety;Standing               Lower Body Dressing: Sitting/lateral leans;Independent Lower Body Dressing Details (indicate cue type and reason): don/doffs socks INDly sitting EOB Toilet Transfer: Supervision/safety;Regular Toilet;Rolling walker (2 wheels)   Toileting- Clothing Manipulation and Hygiene: Independent;Sit to/from stand               Vision  Perception     Praxis      Pertinent Vitals/Pain Pain Assessment Pain Assessment: 0-10 Faces Pain Scale: Hurts a little bit Pain Location: R hip Pain Descriptors / Indicators: Discomfort Pain Intervention(s): Repositioned     Hand Dominance     Extremity/Trunk  Assessment Upper Extremity Assessment Upper Extremity Assessment: Overall WFL for tasks assessed   Lower Extremity Assessment Lower Extremity Assessment: RLE deficits/detail RLE Deficits / Details: 3/5 knee extension, 3/5 hip flexion in sitting   Cervical / Trunk Assessment Cervical / Trunk Assessment: Normal   Communication Communication Communication: No difficulties   Cognition Arousal/Alertness: Awake/alert Behavior During Therapy: WFL for tasks assessed/performed Overall Cognitive Status: Within Functional Limits for tasks assessed                                       General Comments       Exercises Other Exercises Other Exercises: Educ re: LB dressing, car transfer technique, AE, safe use of RW   Shoulder Instructions      Home Living Family/patient expects to be discharged to:: Private residence Living Arrangements: Alone Available Help at Discharge: Family;Available PRN/intermittently Type of Home: House Home Access: Stairs to enter [employer] of Steps: 3 Entrance Stairs-Rails: None Home Layout: One level     Bathroom Shower/Tub: Walk-in shower;Tub/shower unit   Bathroom Toilet: Standard     Home Equipment: None   Additional Comments: Pt's daughter is available to stay with her this week if needed      Prior Functioning/Environment Prior Level of Function : Independent/Modified Independent;Driving;Working/employed             Mobility Comments: Works full time ADLs Comments: IND in B/IADL        OT Problem List: Decreased strength;Decreased range of motion;Decreased activity tolerance;Impaired balance (sitting and/or standing);Decreased knowledge of use of DME or AE      OT Treatment/Interventions:      OT Goals(Current goals can be found in the care plan section) Acute Rehab OT Goals Patient Stated Goal: to get back to usual activities ASAP OT Goal Formulation: With patient Time For Goal Achievement:  06/20/22 Potential to Achieve Goals: Good  OT Frequency:      Co-evaluation              AM-PAC OT "6 Clicks" Daily Activity     Outcome Measure Help from another person eating meals?: None Help from another person taking care of personal grooming?: None Help from another person toileting, which includes using toliet, bedpan, or urinal?: A Little Help from another person bathing (including washing, rinsing, drying)?: A Little Help from another person to put on and taking off regular upper body clothing?: None Help from another person to put on and taking off regular lower body clothing?: A Little 6 Click Score: 21   End of Session Equipment Utilized During Treatment: Rolling walker (2 wheels) Nurse Communication: Mobility status  Activity Tolerance: Patient tolerated treatment well Patient left: in bed;with family/visitor present;with nursing/sitter in room;with call bell/phone within reach  OT Visit Diagnosis: Muscle weakness (generalized) (M62.81)                Time: [id number] OT Time Calculation (min): 20 min Charges:  OT General Charges $OT Visit: 1 Visit OT Evaluation $OT Eval Low Complexity: 1 Low OT Treatments $Self Care/Home Management : 8-22 mins Josiah Lobo, PhD, MS, OTR/L 06/06/22, 10:59 AM

## 2022-06-06 NOTE — Progress Notes (Signed)
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  GOAL:   Patient will meet greater than or equal to 90% of their needs  MONITOR:   PO intake, Supplement acceptance  REASON FOR ASSESSMENT:   Consult Assessment of nutrition requirement/status, Hip fracture protocol  ASSESSMENT:   Pt with history of hypertension, multiple lung nodules, who presents for chief concerns of ground level fall.  Pt admitted with rt femur fracture.   2/11- s/p Right Hip Anterior Hip Hemiarthroplasty   Reviewed I/O's: +672 ml x 24 hours  UOP: 350 ml x 24 hours   Spoke with pt, who was sitting up in recliner chair eating breakfast. Both daughter and pt provide history. Pt shares that she does not eat 3 meals per day and describes herself as a snacker; frequently consumed foods include potato chips and fruit. Pt enjoys going to [employer] and purchasing her meat and produce there. Daughter shares pt prepares herself a steak dinner and baked potato weekly. Her meal are usually simple things that she can bake on the stove or in the microwave.   Per pt and daughter, pt has lost "a lot of weight" over the past 2 years, but unsure what is causing the weight loss. Per pt, she has gotten "all the work-up dine" and is still unsure why she has lost weight. Reviewed CareEverywhere; wt on 11/11/21 was 43.8 kg. Wt has been stable over the past 6-7 months.  Discussed importance of good meal and supplement intake to promote healing. Pt amenable to supplements. She is very motivated to  return home and maintain her independence.   Medications reviewed and include colace.   Labs reviewed: Na: 133.    NUTRITION - FOCUSED PHYSICAL EXAM:  Flowsheet Row Most Recent Value  Orbital Region Mild depletion  Upper Arm Region Severe depletion  Thoracic and Lumbar Region Moderate depletion  Buccal Region Mild depletion  Temple Region Mild depletion  Clavicle Bone Region Severe depletion  Clavicle and Acromion Bone Region Severe depletion  Scapular Bone Region Severe depletion  Dorsal Hand Moderate depletion  Patellar Region Moderate depletion  Anterior Thigh Region Moderate depletion  Posterior Calf Region Moderate depletion  Edema (RD Assessment) None  Hair Reviewed  Eyes Reviewed  Mouth Reviewed  Skin Reviewed  Nails Reviewed       Diet Order:   Diet Order             Diet regular Room service appropriate? Yes; Fluid consistency: Thin  Diet effective now                   EDUCATION NEEDS:   Education needs have been addressed  Skin:  Skin Assessment: Skin Integrity Issues: Skin Integrity Issues:: Incisions Incisions: closed rt hip  Last BM:  06/02/22  Height:   Ht Readings from Last 1 Encounters:  06/04/22 5' 6"$  (1.676 m)    Weight:   Wt Readings from Last 1 Encounters:  06/04/22 43.5 kg    Ideal Body Weight:  59.1 kg  BMI:  Body mass index is 15.49 kg/m.  Estimated Nutritional Needs:   Kcal:  1550-1750  Protein:  85-100 grams  Fluid:  > 1.5 L    Loistine Chance, RD, LDN, [place] Registered Dietitian II Certified Diabetes Care and Education Specialist Please refer to [facility] for RD and/or RD on-call/weekend/after hours pager

## 2022-06-07 LAB — SURGICAL PATHOLOGY
